# Patient Record
Sex: Female | Born: 1990 | Race: White | Hispanic: No | Smoking: Never smoker
Health system: Southern US, Community
[De-identification: ages and names within clinical notes are randomized; demographics above are authoritative.]

## PROBLEM LIST (undated history)

## (undated) ENCOUNTER — Inpatient Hospital Stay (HOSPITAL_COMMUNITY): Payer: Self-pay

## (undated) DIAGNOSIS — F419 Anxiety disorder, unspecified: Secondary | ICD-10-CM

## (undated) DIAGNOSIS — J45909 Unspecified asthma, uncomplicated: Secondary | ICD-10-CM

## (undated) DIAGNOSIS — F329 Major depressive disorder, single episode, unspecified: Secondary | ICD-10-CM

## (undated) DIAGNOSIS — R000 Tachycardia, unspecified: Secondary | ICD-10-CM

## (undated) DIAGNOSIS — F32A Depression, unspecified: Secondary | ICD-10-CM

## (undated) HISTORY — PX: WISDOM TOOTH EXTRACTION: SHX21

## (undated) HISTORY — PX: MYRINGOTOMY: SUR874

---
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
## 2011-03-21 ENCOUNTER — Ambulatory Visit (INDEPENDENT_AMBULATORY_CARE_PROVIDER_SITE_OTHER): Payer: 59 | Admitting: Family Medicine

## 2011-03-21 VITALS — BP 106/74 | HR 83 | Temp 98.1°F | Resp 16 | Ht 61.0 in | Wt 129.0 lb

## 2011-03-21 DIAGNOSIS — J029 Acute pharyngitis, unspecified: Secondary | ICD-10-CM

## 2011-03-21 DIAGNOSIS — H6690 Otitis media, unspecified, unspecified ear: Secondary | ICD-10-CM

## 2011-03-21 MED ORDER — CEFDINIR 300 MG PO CAPS: 300.0000 mg | ORAL_CAPSULE | Freq: Two times a day (BID) | ORAL | Status: AC

## 2011-03-21 NOTE — Patient Instructions (Signed)
Thank you for coming in today.  I appreciate your patience as we become more comfortable with our computer system.  Today you saw Contrell Ballentine, MD. I hope you feel better quickly. If you were not given printed prescriptions today, your medications have been sent to your specified pharmacy and can be picked up there.   

## 2011-03-21 NOTE — Progress Notes (Signed)
  Subjective:    Patient ID: Olivia Dean, female    DOB: 10/22/1990, 21 y.o.   MRN: [id number]  HPI 21 yo female with URI symptoms for 4 days.   Nasal congestion, running, sore throat, right ear pain/pressure. No fever.  Tickly cough.  REd dots in throat.  No nausea, vomitting.  Slight headache.    Review of Systems    Negative except as per HPI  Objective:   Physical Exam  Constitutional: She appears well-developed. No distress.  HENT:  Right Ear: External ear and ear canal normal. Tympanic membrane is injected and bulging. Tympanic membrane is not scarred, not perforated, not erythematous and not retracted.  Left Ear: Tympanic membrane, external ear and ear canal normal. Tympanic membrane is not injected, not scarred, not perforated, not erythematous, not retracted and not bulging.  Nose: No mucosal edema or rhinorrhea. Right sinus exhibits no maxillary sinus tenderness and no frontal sinus tenderness. Left sinus exhibits no maxillary sinus tenderness and no frontal sinus tenderness.  Mouth/Throat: Uvula is midline, oropharynx is clear and moist and mucous membranes are normal. No oropharyngeal exudate or tonsillar abscesses.  Cardiovascular: Normal rate, regular rhythm, normal heart sounds and intact distal pulses.   No murmur heard. Pulmonary/Chest: Effort normal and breath sounds normal. No respiratory distress. She has no wheezes. She has no rales.  Lymphadenopathy:       Head (right side): No submandibular and no preauricular adenopathy present.       Head (left side): No submandibular and no preauricular adenopathy present.       Right cervical: No superficial cervical and no posterior cervical adenopathy present.      Left cervical: No superficial cervical and no posterior cervical adenopathy present.       Right: No supraclavicular adenopathy present.       Left: No supraclavicular adenopathy present.  Skin: Skin is warm and dry.          Assessment & Plan:   URI Right otitis media  omnicef 300 BID for 10 days.

## 2012-11-02 ENCOUNTER — Emergency Department (HOSPITAL_COMMUNITY): Payer: 59

## 2012-11-02 ENCOUNTER — Emergency Department (HOSPITAL_COMMUNITY)
Admission: EM | Admit: 2012-11-02 | Discharge: 2012-11-03 | Disposition: A | Discharge status: Home / Self Care | Payer: 59 | Attending: Emergency Medicine | Admitting: Emergency Medicine

## 2012-11-02 ENCOUNTER — Encounter (HOSPITAL_COMMUNITY): Payer: Self-pay | Admitting: Emergency Medicine

## 2012-11-02 DIAGNOSIS — R0602 Shortness of breath: Secondary | ICD-10-CM | POA: Insufficient documentation

## 2012-11-02 DIAGNOSIS — Z79899 Other long term (current) drug therapy: Secondary | ICD-10-CM | POA: Insufficient documentation

## 2012-11-02 DIAGNOSIS — F411 Generalized anxiety disorder: Secondary | ICD-10-CM | POA: Insufficient documentation

## 2012-11-02 DIAGNOSIS — R0789 Other chest pain: Secondary | ICD-10-CM | POA: Insufficient documentation

## 2012-11-02 DIAGNOSIS — R000 Tachycardia, unspecified: Secondary | ICD-10-CM | POA: Insufficient documentation

## 2012-11-02 DIAGNOSIS — Z880 Allergy status to penicillin: Secondary | ICD-10-CM | POA: Insufficient documentation

## 2012-11-02 DIAGNOSIS — R110 Nausea: Secondary | ICD-10-CM | POA: Insufficient documentation

## 2012-11-02 MED ORDER — KETOROLAC TROMETHAMINE 30 MG/ML IJ SOLN
30.0000 mg | Freq: Once | INTRAMUSCULAR | Status: AC
  Administered 2012-11-02: 30 mg via INTRAMUSCULAR
  Filled 2012-11-02: qty 1

## 2012-11-02 MED ORDER — KETOROLAC TROMETHAMINE 30 MG/ML IJ SOLN: 30.0000 mg | Freq: Once | INTRAMUSCULAR | Status: DC

## 2012-11-02 NOTE — ED Notes (Signed)
I have been having real bad chest pain for the past couple of days. Pain in the center of my chest with a lot of pressure per pt. Feel nauseated at this time and feel like I may vomit, but I think it is because I am really nervous.

## 2012-11-02 NOTE — ED Provider Notes (Signed)
ECG interpretation   Date: 11/02/2012  Rate: 101  Rhythm: normal sinus rhythm  QRS Axis: normal  Intervals: normal  ST/T Wave abnormalities: normal  Conduction Disutrbances: none  Narrative Interpretation:   Old EKG Reviewed: No significant changes noted     Lyanne Co, MD 11/02/12 2213

## 2012-11-02 NOTE — ED Notes (Signed)
Pt states under some stress at this time. Pt states she just tries to calm herself down, & that she was on medication at one time but not at the present. Chest pain mid chest that does not radiate. Pt denies n/v.

## 2012-11-02 NOTE — ED Provider Notes (Signed)
CSN: [id number]     Arrival date & time 11/02/12  2146 History  This chart was scribed for No att. providers found by Ronal Fear, ED Scribe. This patient was seen in room APA17/APA17 and the patient's care was started at 11:20 PM.     Chief Complaint  Patient presents with  . Chest Pain   The history is provided by the patient. No language interpreter was used.    HPI Comments: Olivia Dean is a 22 y.o. female who presents to the Emergency Department complaining of sudden onset 3/10 chest  pain and pressure that is worse with anxiety while at work with associated mild SOB and nausea. Pt denies fever, chills, cough, congestion. Pt is taking Prozac with no relief. Denies hx of blood clots or muscle weakness. She denies any recent long distance travel. Pt is not currently on birth control.  Patient reports ocassional chest pain in the setting of anxiety in the past.  Patient currently rates pain at 2-3/10.  It is nonradiating and substernal.  Patient can point to one spot on her chest that hurts.   History reviewed. No pertinent past medical history. History reviewed. No pertinent past surgical history. No family history on file. History  Substance Use Topics  . Smoking status: Never Smoker   . Smokeless tobacco: Not on file  . Alcohol Use: Yes   OB History   Grav Para Term Preterm Abortions TAB SAB Ect Mult Living                 Review of Systems  Constitutional: Negative for fever.  Respiratory: Positive for shortness of breath.   Cardiovascular: Positive for chest pain. Negative for leg swelling.  Gastrointestinal: Negative for abdominal pain.  All other systems reviewed and are negative.    Allergies  Penicillins and Nickel  Home Medications   Current Outpatient Rx  Name  Route  Sig  Dispense  Refill  . BIOTIN PO   Oral   Take 1 tablet by mouth daily.         Marland Kitchen FLUoxetine (PROZAC) 20 MG capsule   Oral   Take 20 mg by mouth daily.         Marland Kitchen ibuprofen  (ADVIL,MOTRIN) 600 MG tablet   Oral   Take 1 tablet (600 mg total) by mouth every 6 (six) hours as needed for pain.   30 tablet   0    BP 128/73  Pulse 97  Temp(Src) 98.7 F (37.1 C) (Oral)  Resp 16  Ht 5\' 1"  (1.549 m)  Wt 135 lb (61.236 kg)  BMI 25.52 kg/m2  SpO2 99%  LMP 10/26/2012 Physical Exam  Nursing note and vitals reviewed. Constitutional: She is oriented to person, place, and time. She appears well-developed and well-nourished.  HENT:  Head: Normocephalic and atraumatic.  Cardiovascular: Regular rhythm and normal heart sounds.   tachycardia  Pulmonary/Chest: Effort normal and breath sounds normal. No respiratory distress. She exhibits tenderness.  TTP over anterior chest wall.  Abdominal: Soft. Bowel sounds are normal. There is no tenderness.  Musculoskeletal: She exhibits no edema.  Neurological: She is alert and oriented to person, place, and time.  Skin: Skin is warm and dry.  Psychiatric: She has a normal mood and affect.    ED Course  Procedures (including critical care time)  11:22 PM- Pt advised of plan for treatment including Chest X-ray and pain medication and pt agrees.   Labs Review Labs Reviewed  D-DIMER, QUANTITATIVE  Imaging Review Dg Chest 2 View  11/02/2012   CLINICAL DATA:  Central chest pressure, nausea and vomiting  EXAM: CHEST  2 VIEW  COMPARISON:  11/13/2006  FINDINGS: The heart size and mediastinal contours are within normal limits. Both lungs are clear. The visualized skeletal structures are unremarkable.  IMPRESSION: Normal chest radiograph.   Electronically Signed   By: Genevive Bi M.D.   On: 11/02/2012 22:34    EKG Interpretation   None     EkG:  Sinus tachycardia without evidence of ischemia  MDM   1. Musculoskeletal chest pain    Patient presents with CP.  Non toxic and VS notable for tachycardia.  Low risk for PE.  Dimer is negative.  CHest xray, EKG and trop reassuring.  Reproducible chest pain on exam.  Given  toradol.  Patient later endorsed that sometimes she gets the pain after sex.  Patient reassured and encouraged to use Ibuprofen at home for relief.  To f/u with PCP.  After history, exam, and medical workup I feel the patient has been appropriately medically screened and is safe for discharge home. Pertinent diagnoses were discussed with the patient. Patient was given return precautions.   I personally performed the services described in this documentation, which was scribed in my presence. The recorded information has been reviewed and is accurate.    Shon Baton, MD 11/04/12 [phone]

## 2012-11-03 MED ORDER — IBUPROFEN 600 MG PO TABS: 600.0000 mg | ORAL_TABLET | Freq: Four times a day (QID) | ORAL | Status: DC | PRN

## 2012-11-03 NOTE — ED Notes (Signed)
Pt alert & oriented x4, stable gait. Patient given discharge instructions, paperwork & prescription(s). Patient  instructed to stop at the registration desk to finish any additional paperwork. Patient verbalized understanding. Pt left department w/ no further questions. 

## 2013-12-23 LAB — OB RESULTS CONSOLE HIV ANTIBODY (ROUTINE TESTING): HIV: NONREACTIVE

## 2013-12-23 LAB — OB RESULTS CONSOLE ABO/RH: RH Type: POSITIVE

## 2013-12-23 LAB — OB RESULTS CONSOLE HEPATITIS B SURFACE ANTIGEN: HEP B S AG: NEGATIVE

## 2013-12-23 LAB — OB RESULTS CONSOLE RUBELLA ANTIBODY, IGM: Rubella: IMMUNE

## 2013-12-23 LAB — OB RESULTS CONSOLE GC/CHLAMYDIA
CHLAMYDIA, DNA PROBE: NEGATIVE
GC PROBE AMP, GENITAL: NEGATIVE

## 2013-12-23 LAB — OB RESULTS CONSOLE RPR: RPR: NONREACTIVE

## 2013-12-23 LAB — OB RESULTS CONSOLE ANTIBODY SCREEN: Antibody Screen: NEGATIVE

## 2014-05-22 ENCOUNTER — Inpatient Hospital Stay (HOSPITAL_COMMUNITY)
Admission: AD | Admit: 2014-05-22 | Discharge: 2014-05-22 | Disposition: A | Discharge status: Home / Self Care | Payer: Managed Care, Other (non HMO) | Source: Ambulatory Visit | Attending: Obstetrics and Gynecology | Admitting: Obstetrics and Gynecology

## 2014-05-22 ENCOUNTER — Encounter (HOSPITAL_COMMUNITY): Payer: Self-pay | Admitting: *Deleted

## 2014-05-22 DIAGNOSIS — N898 Other specified noninflammatory disorders of vagina: Secondary | ICD-10-CM | POA: Insufficient documentation

## 2014-05-22 DIAGNOSIS — Z3A30 30 weeks gestation of pregnancy: Secondary | ICD-10-CM | POA: Insufficient documentation

## 2014-05-22 DIAGNOSIS — O26892 Other specified pregnancy related conditions, second trimester: Secondary | ICD-10-CM | POA: Diagnosis not present

## 2014-05-22 DIAGNOSIS — O9989 Other specified diseases and conditions complicating pregnancy, childbirth and the puerperium: Secondary | ICD-10-CM | POA: Insufficient documentation

## 2014-05-22 HISTORY — DX: Tachycardia, unspecified: R00.0

## 2014-05-22 HISTORY — DX: Major depressive disorder, single episode, unspecified: F32.9

## 2014-05-22 HISTORY — DX: Anxiety disorder, unspecified: F41.9

## 2014-05-22 HISTORY — DX: Depression, unspecified: F32.A

## 2014-05-22 HISTORY — DX: Unspecified asthma, uncomplicated: J45.909

## 2014-05-22 LAB — URINALYSIS, ROUTINE W REFLEX MICROSCOPIC
Bilirubin Urine: NEGATIVE
GLUCOSE, UA: NEGATIVE mg/dL
HGB URINE DIPSTICK: NEGATIVE
KETONES UR: NEGATIVE mg/dL
Leukocytes, UA: NEGATIVE
Nitrite: NEGATIVE
PH: 7.5 (ref 5.0–8.0)
Protein, ur: NEGATIVE mg/dL
Specific Gravity, Urine: 1.015 (ref 1.005–1.030)
Urobilinogen, UA: 0.2 mg/dL (ref 0.0–1.0)

## 2014-05-22 LAB — AMNISURE RUPTURE OF MEMBRANE (ROM) NOT AT ARMC: AMNISURE: NEGATIVE

## 2014-05-22 NOTE — Discharge Instructions (Signed)
Third Trimester of Pregnancy The third trimester is from week 29 through week 42, months 7 through 9. The third trimester is a time when the fetus is growing rapidly. At the end of the ninth month, the fetus is about 20 inches in length and weighs 6-10 pounds.  BODY CHANGES Your body goes through many changes during pregnancy. The changes vary from woman to woman.   Your weight will continue to increase. You can expect to gain 25-35 pounds (11-16 kg) by the end of the pregnancy.  You may begin to get stretch marks on your hips, abdomen, and breasts.  You may urinate more often because the fetus is moving lower into your pelvis and pressing on your bladder.  You may develop or continue to have heartburn as a result of your pregnancy.  You may develop constipation because certain hormones are causing the muscles that push waste through your intestines to slow down.  You may develop hemorrhoids or swollen, bulging veins (varicose veins).  You may have pelvic pain because of the weight gain and pregnancy hormones relaxing your joints between the bones in your pelvis. Backaches may result from overexertion of the muscles supporting your posture.  You may have changes in your hair. These can include thickening of your hair, rapid growth, and changes in texture. Some women also have hair loss during or after pregnancy, or hair that feels dry or thin. Your hair will most likely return to normal after your baby is born.  Your breasts will continue to grow and be tender. A yellow discharge may leak from your breasts called colostrum.  Your belly button may stick out.  You may feel short of breath because of your expanding uterus.  You may notice the fetus "dropping," or moving lower in your abdomen.  You may have a bloody mucus discharge. This usually occurs a few days to a week before labor begins.  Your cervix becomes thin and soft (effaced) near your due date. WHAT TO EXPECT AT YOUR PRENATAL  EXAMS  You will have prenatal exams every 2 weeks until week 36. Then, you will have weekly prenatal exams. During a routine prenatal visit:  You will be weighed to make sure you and the fetus are growing normally.  Your blood pressure is taken.  Your abdomen will be measured to track your baby's growth.  The fetal heartbeat will be listened to.  Any test results from the previous visit will be discussed.  You may have a cervical check near your due date to see if you have effaced. At around 36 weeks, your caregiver will check your cervix. At the same time, your caregiver will also perform a test on the secretions of the vaginal tissue. This test is to determine if a type of bacteria, Group B streptococcus, is present. Your caregiver will explain this further. Your caregiver may ask you:  What your birth plan is.  How you are feeling.  If you are feeling the baby move.  If you have had any abnormal symptoms, such as leaking fluid, bleeding, severe headaches, or abdominal cramping.  If you have any questions. Other tests or screenings that may be performed during your third trimester include:  Blood tests that check for low iron levels (anemia).  Fetal testing to check the health, activity level, and growth of the fetus. Testing is done if you have certain medical conditions or if there are problems during the pregnancy. FALSE LABOR You may feel small, irregular contractions that   eventually go away. These are called Braxton Hicks contractions, or false labor. Contractions may last for hours, days, or even weeks before true labor sets in. If contractions come at regular intervals, intensify, or become painful, it is best to be seen by your caregiver.  SIGNS OF LABOR   Menstrual-like cramps.  Contractions that are 5 minutes apart or less.  Contractions that start on the top of the uterus and spread down to the lower abdomen and back.  A sense of increased pelvic pressure or back  pain.  A watery or bloody mucus discharge that comes from the vagina. If you have any of these signs before the 37th week of pregnancy, call your caregiver right away. You need to go to the hospital to get checked immediately. HOME CARE INSTRUCTIONS   Avoid all smoking, herbs, alcohol, and unprescribed drugs. These chemicals affect the formation and growth of the baby.  Follow your caregiver's instructions regarding medicine use. There are medicines that are either safe or unsafe to take during pregnancy.  Exercise only as directed by your caregiver. Experiencing uterine cramps is a good sign to stop exercising.  Continue to eat regular, healthy meals.  Wear a good support bra for breast tenderness.  Do not use hot tubs, steam rooms, or saunas.  Wear your seat belt at all times when driving.  Avoid raw meat, uncooked cheese, cat litter boxes, and soil used by cats. These carry germs that can cause birth defects in the baby.  Take your prenatal vitamins.  Try taking a stool softener (if your caregiver approves) if you develop constipation. Eat more high-fiber foods, such as fresh vegetables or fruit and whole grains. Drink plenty of fluids to keep your urine clear or pale yellow.  Take warm sitz baths to soothe any pain or discomfort caused by hemorrhoids. Use hemorrhoid cream if your caregiver approves.  If you develop varicose veins, wear support hose. Elevate your feet for 15 minutes, 3-4 times a day. Limit salt in your diet.  Avoid heavy lifting, wear low heal shoes, and practice good posture.  Rest a lot with your legs elevated if you have leg cramps or low back pain.  Visit your dentist if you have not gone during your pregnancy. Use a soft toothbrush to brush your teeth and be gentle when you floss.  A sexual relationship may be continued unless your caregiver directs you otherwise.  Do not travel far distances unless it is absolutely necessary and only with the approval  of your caregiver.  Take prenatal classes to understand, practice, and ask questions about the labor and delivery.  Make a trial run to the hospital.  Pack your hospital bag.  Prepare the baby's nursery.  Continue to go to all your prenatal visits as directed by your caregiver. SEEK MEDICAL CARE IF:  You are unsure if you are in labor or if your water has broken.  You have dizziness.  You have mild pelvic cramps, pelvic pressure, or nagging pain in your abdominal area.  You have persistent nausea, vomiting, or diarrhea.  You have a bad smelling vaginal discharge.  You have pain with urination. SEEK IMMEDIATE MEDICAL CARE IF:   You have a fever.  You are leaking fluid from your vagina.  You have spotting or bleeding from your vagina.  You have severe abdominal cramping or pain.  You have rapid weight loss or gain.  You have shortness of breath with chest pain.  You notice sudden or extreme swelling   of your face, hands, ankles, feet, or legs.  You have not felt your baby move in over an hour.  You have severe headaches that do not go away with medicine.  You have vision changes. Document Released: 12/27/2000 Document Revised: 01/07/2013 Document Reviewed: 03/05/2012 Exit[employer] Patient Information 2015 [employer]. This information is not intended to replace advice given to you by your health care provider. Make sure you discuss any questions you have with your health care provider.  

## 2014-05-22 NOTE — MAU Note (Signed)
Pt felt gush of fluid @ approx 1230 today, felt two more gushes.  Now still feels damp, unsure if leaking fluid.  Abdomen has felt tighter today, denies bleeding.

## 2014-05-22 NOTE — Progress Notes (Signed)
Patient states she has "really bad anxiety" and has been hospitalized for tachycardia before.

## 2014-05-22 NOTE — MAU Provider Note (Signed)
History     CSN: [id number]  Arrival date and time: 05/22/14 1705   First Provider Initiated Contact with Patient 05/22/14 1821      Chief Complaint  Patient presents with  . Rupture of Membranes   HPI  24 y.o. G1P0 @ 6337w0d presents to the MAU after she felt 2 gushes of fluid during her graduation ceremony. She denies vaginal bleeding,she reports braxton Hicks contractions. She states baby is moving well.  Past Medical History  Diagnosis Date  . Anxiety   . Asthma     exercise induced  . Tachycardia   . Depression     Past Surgical History  Procedure Laterality Date  . Myringotomy      History reviewed. No pertinent family history.  History  Substance Use Topics  . Smoking status: Never Smoker   . Smokeless tobacco: Not on file  . Alcohol Use: Yes    Allergies:  Allergies  Allergen Reactions  . Penicillins Hives  . Nickel Rash    Prescriptions prior to admission  Medication Sig Dispense Refill Last Dose  . flintstones complete (FLINTSTONES) 60 MG chewable tablet Chew 1 tablet by mouth daily.   05/22/2014 at Unknown time  . Prenatal Vit-Fe Fumarate-FA (PRENATAL MULTIVITAMIN) TABS tablet Take 1 tablet by mouth daily at 12 noon.   05/21/2014 at Unknown time  . ibuprofen (ADVIL,MOTRIN) 600 MG tablet Take 1 tablet (600 mg total) by mouth every 6 (six) hours as needed for pain. (Patient not taking: Reported on 05/22/2014) 30 tablet 0 Not Taking at Unknown time    Review of Systems  Gastrointestinal: Positive for abdominal pain.       Mild abdominal pain; braxton hicks contractions  Genitourinary:       Having vaginal discharge; Leaking fluid  All other systems reviewed and are negative.  Physical Exam   Blood pressure 124/73, pulse 107, temperature 98 F (36.7 C), temperature source Oral, resp. rate 20, SpO2 98 %. Results for orders placed or performed during the hospital encounter of 05/22/14 (from the past 24 hour(s))  Urinalysis, Routine w reflex microscopic      Status: Abnormal   Collection Time: 05/22/14  5:27 PM  Result Value Ref Range   Color, Urine YELLOW YELLOW   APPearance HAZY (A) CLEAR   Specific Gravity, Urine 1.015 1.005 - 1.030   pH 7.5 5.0 - 8.0   Glucose, UA NEGATIVE NEGATIVE mg/dL   Hgb urine dipstick NEGATIVE NEGATIVE   Bilirubin Urine NEGATIVE NEGATIVE   Ketones, ur NEGATIVE NEGATIVE mg/dL   Protein, ur NEGATIVE NEGATIVE mg/dL   Urobilinogen, UA 0.2 0.0 - 1.0 mg/dL   Nitrite NEGATIVE NEGATIVE   Leukocytes, UA NEGATIVE NEGATIVE  Amnisure rupture of membrane (rom)     Status: None   Collection Time: 05/22/14  6:24 PM  Result Value Ref Range   Amnisure ROM NEGATIVE     Physical Exam  Nursing note and vitals reviewed. Constitutional: She is oriented to person, place, and time. She appears well-developed and well-nourished. No distress.  HENT:  Head: Normocephalic and atraumatic.  Neck: Normal range of motion.  Cardiovascular: Normal rate.   Respiratory: Effort normal. No respiratory distress.  GI: Soft.  Musculoskeletal: Normal range of motion. She exhibits no edema.  Neurological: She is alert and oriented to person, place, and time.  Skin: Skin is warm and dry.  Psychiatric: She has a normal mood and affect. Her behavior is normal. Judgment and thought content normal.   Fht's Category 1  Uterine irritability MAU Course  Procedures  MDM Efm; amnisure  Assessment and Plan  IUP @ 30+0 Vaginal Discharge   Keep next regular scheduled appt Return to MAU prn   Billiejean Schimek Grissett 05/22/2014, 6:25 PM

## 2014-07-15 ENCOUNTER — Encounter (HOSPITAL_COMMUNITY): Payer: Self-pay | Admitting: *Deleted

## 2014-07-15 ENCOUNTER — Inpatient Hospital Stay (HOSPITAL_COMMUNITY)
Admission: AD | Admit: 2014-07-15 | Discharge: 2014-07-15 | Disposition: A | Discharge status: Home / Self Care | Payer: Managed Care, Other (non HMO) | Source: Ambulatory Visit | Attending: Obstetrics and Gynecology | Admitting: Obstetrics and Gynecology

## 2014-07-15 DIAGNOSIS — O9989 Other specified diseases and conditions complicating pregnancy, childbirth and the puerperium: Secondary | ICD-10-CM | POA: Diagnosis present

## 2014-07-15 DIAGNOSIS — N898 Other specified noninflammatory disorders of vagina: Secondary | ICD-10-CM | POA: Diagnosis not present

## 2014-07-15 DIAGNOSIS — Z3A37 37 weeks gestation of pregnancy: Secondary | ICD-10-CM | POA: Diagnosis not present

## 2014-07-15 LAB — POCT FERN TEST: POCT Fern Test: NEGATIVE

## 2014-07-15 NOTE — MAU Note (Signed)
Urine in lab 

## 2014-07-15 NOTE — MAU Note (Signed)
Patient presents at [redacted] weeks gestation with c/o leaking fluid since 0200 today. Fetus active. Denies bleeding.

## 2014-07-15 NOTE — MAU Provider Note (Signed)
Subjective:  [name] is a 24 y.o. female G1P0 at 4970w5d presenting to MAU with concerns of ROM.  She woke up feeling like her pregnancy pillow was wet. She did not recall urinating on herself. She has not continued to leak fluid while here, however does have a lot of white vaginal discharge.   Objective:  GENERAL: Well-developed, well-nourished female in no acute distress.  LUNGS: Effort normal SKIN: Warm, dry and without erythema PSYCH: Normal mood and affect  Filed Vitals:   07/15/14 1127  BP: 128/68  Pulse: 93  Temp:   Resp: 18   Speculum exam: Vagina - Small amount of creamy, white discharge, no odor. No pooling of clear fluid in the vagina Cervix - No contact bleeding Chaperone present for exam.  MDM:  Crist FatFern slide negative for amniotic fluid  Assessment:  Vaginal discharge in the third trimester    Plan:  RN to inform Dr. Lianne MorisLowe     Wilmont Olund I Chaunice Obie, NP 07/15/2014 10:40 AM

## 2014-07-15 NOTE — Discharge Instructions (Signed)
Keep your scheduled appointment for prenatal care. Call the office or provider on call with further concerns or return to MAU as needed. Drink 8-10 glasses of water per day.

## 2014-07-31 ENCOUNTER — Encounter (HOSPITAL_COMMUNITY): Payer: Self-pay | Admitting: *Deleted

## 2014-07-31 ENCOUNTER — Telehealth (HOSPITAL_COMMUNITY): Payer: Self-pay | Admitting: *Deleted

## 2014-07-31 LAB — OB RESULTS CONSOLE GBS: GBS: NEGATIVE

## 2014-07-31 NOTE — Telephone Encounter (Signed)
Preadmission screen  

## 2014-08-06 ENCOUNTER — Inpatient Hospital Stay (HOSPITAL_COMMUNITY): Payer: Managed Care, Other (non HMO) | Admitting: Anesthesiology

## 2014-08-06 ENCOUNTER — Encounter (HOSPITAL_COMMUNITY)
Admission: RE | Disposition: A | Discharge status: Home / Self Care | Payer: Self-pay | Source: Ambulatory Visit | Attending: Obstetrics and Gynecology

## 2014-08-06 ENCOUNTER — Encounter (HOSPITAL_COMMUNITY): Payer: Self-pay

## 2014-08-06 ENCOUNTER — Inpatient Hospital Stay (HOSPITAL_COMMUNITY)
Admission: RE | Admit: 2014-08-06 | Discharge: 2014-08-09 | DRG: 766 | Disposition: A | Discharge status: Home / Self Care | Payer: Managed Care, Other (non HMO) | Source: Ambulatory Visit | Attending: Obstetrics and Gynecology | Admitting: Obstetrics and Gynecology

## 2014-08-06 DIAGNOSIS — D72829 Elevated white blood cell count, unspecified: Secondary | ICD-10-CM | POA: Diagnosis not present

## 2014-08-06 DIAGNOSIS — Z833 Family history of diabetes mellitus: Secondary | ICD-10-CM

## 2014-08-06 DIAGNOSIS — Z3A40 40 weeks gestation of pregnancy: Secondary | ICD-10-CM | POA: Diagnosis present

## 2014-08-06 DIAGNOSIS — O339 Maternal care for disproportion, unspecified: Principal | ICD-10-CM | POA: Diagnosis present

## 2014-08-06 DIAGNOSIS — O480 Post-term pregnancy: Secondary | ICD-10-CM | POA: Diagnosis present

## 2014-08-06 DIAGNOSIS — Z8249 Family history of ischemic heart disease and other diseases of the circulatory system: Secondary | ICD-10-CM | POA: Diagnosis not present

## 2014-08-06 LAB — TYPE AND SCREEN
ABO/RH(D): O POS
ANTIBODY SCREEN: NEGATIVE

## 2014-08-06 LAB — CBC
HEMATOCRIT: 38.9 % (ref 36.0–46.0)
Hemoglobin: 13.3 g/dL (ref 12.0–15.0)
MCH: 30.6 pg (ref 26.0–34.0)
MCHC: 34.2 g/dL (ref 30.0–36.0)
MCV: 89.6 fL (ref 78.0–100.0)
Platelets: 153 10*3/uL (ref 150–400)
RBC: 4.34 MIL/uL (ref 3.87–5.11)
RDW: 13.8 % (ref 11.5–15.5)
WBC: 15.6 10*3/uL — ABNORMAL HIGH (ref 4.0–10.5)

## 2014-08-06 LAB — RPR: RPR: NONREACTIVE

## 2014-08-06 LAB — ABO/RH: ABO/RH(D): O POS

## 2014-08-06 SURGERY — Surgical Case
Anesthesia: Epidural | Site: Abdomen

## 2014-08-06 MED ORDER — MORPHINE SULFATE 0.5 MG/ML IJ SOLN
INTRAMUSCULAR | Status: AC
  Filled 2014-08-06: qty 10

## 2014-08-06 MED ORDER — OXYTOCIN 10 UNIT/ML IJ SOLN
INTRAMUSCULAR | Status: AC
  Filled 2014-08-06: qty 4

## 2014-08-06 MED ORDER — ONDANSETRON HCL 4 MG/2ML IJ SOLN
INTRAMUSCULAR | Status: DC | PRN
  Administered 2014-08-06: 4 mg via INTRAVENOUS

## 2014-08-06 MED ORDER — OXYTOCIN 10 UNIT/ML IJ SOLN
40.0000 [IU] | INTRAVENOUS | Status: DC | PRN
  Administered 2014-08-06: 40 [IU] via INTRAVENOUS

## 2014-08-06 MED ORDER — PHENYLEPHRINE 40 MCG/ML (10ML) SYRINGE FOR IV PUSH (FOR BLOOD PRESSURE SUPPORT)
80.0000 ug | PREFILLED_SYRINGE | INTRAVENOUS | Status: DC | PRN
  Filled 2014-08-06: qty 20

## 2014-08-06 MED ORDER — CEFAZOLIN (ANCEF) 1 G IV SOLR: 1.0000 g | INTRAVENOUS | Status: DC

## 2014-08-06 MED ORDER — DIPHENHYDRAMINE HCL 50 MG/ML IJ SOLN: 12.5000 mg | INTRAMUSCULAR | Status: DC | PRN

## 2014-08-06 MED ORDER — LIDOCAINE-EPINEPHRINE (PF) 2 %-1:200000 IJ SOLN
INTRAMUSCULAR | Status: AC
  Filled 2014-08-06: qty 20

## 2014-08-06 MED ORDER — LIDOCAINE HCL (PF) 1 % IJ SOLN: 30.0000 mL | INTRAMUSCULAR | Status: DC | PRN

## 2014-08-06 MED ORDER — LACTATED RINGERS IV SOLN
INTRAVENOUS | Status: DC | PRN
  Administered 2014-08-06: 23:00:00 via INTRAVENOUS

## 2014-08-06 MED ORDER — OXYCODONE-ACETAMINOPHEN 5-325 MG PO TABS: 2.0000 | ORAL_TABLET | ORAL | Status: DC | PRN

## 2014-08-06 MED ORDER — MEPERIDINE HCL 25 MG/ML IJ SOLN: 6.2500 mg | INTRAMUSCULAR | Status: DC | PRN

## 2014-08-06 MED ORDER — ACETAMINOPHEN 325 MG PO TABS
650.0000 mg | ORAL_TABLET | ORAL | Status: DC | PRN
Start: 2014-08-06 — End: 2014-08-07

## 2014-08-06 MED ORDER — ACETAMINOPHEN 500 MG PO TABS
1000.0000 mg | ORAL_TABLET | Freq: Four times a day (QID) | ORAL | Status: DC
  Administered 2014-08-07: 1000 mg via ORAL

## 2014-08-06 MED ORDER — FENTANYL 2.5 MCG/ML BUPIVACAINE 1/10 % EPIDURAL INFUSION (WH - ANES)
14.0000 mL/h | INTRAMUSCULAR | Status: DC | PRN
  Administered 2014-08-06 (×2): 14 mL/h via EPIDURAL
  Filled 2014-08-06 (×2): qty 125

## 2014-08-06 MED ORDER — LACTATED RINGERS IV SOLN: 500.0000 mL | INTRAVENOUS | Status: DC | PRN

## 2014-08-06 MED ORDER — ONDANSETRON HCL 4 MG/2ML IJ SOLN
INTRAMUSCULAR | Status: AC
  Filled 2014-08-06: qty 2

## 2014-08-06 MED ORDER — OXYCODONE-ACETAMINOPHEN 5-325 MG PO TABS: 1.0000 | ORAL_TABLET | ORAL | Status: DC | PRN

## 2014-08-06 MED ORDER — SCOPOLAMINE 1 MG/3DAYS TD PT72
MEDICATED_PATCH | TRANSDERMAL | Status: DC | PRN
  Administered 2014-08-06: 1 via TRANSDERMAL

## 2014-08-06 MED ORDER — OXYTOCIN BOLUS FROM INFUSION: 500.0000 mL | INTRAVENOUS | Status: DC

## 2014-08-06 MED ORDER — EPHEDRINE 5 MG/ML INJ: 10.0000 mg | INTRAVENOUS | Status: DC | PRN

## 2014-08-06 MED ORDER — LACTATED RINGERS IV SOLN
INTRAVENOUS | Status: DC
  Administered 2014-08-06: 13:00:00 via INTRAVENOUS

## 2014-08-06 MED ORDER — MORPHINE SULFATE (PF) 0.5 MG/ML IJ SOLN
INTRAMUSCULAR | Status: DC | PRN
  Administered 2014-08-06: 1 mg via INTRAVENOUS
  Administered 2014-08-06: 4 mg via EPIDURAL

## 2014-08-06 MED ORDER — CITRIC ACID-SODIUM CITRATE 334-500 MG/5ML PO SOLN
30.0000 mL | ORAL | Status: DC | PRN
  Administered 2014-08-06 (×2): 30 mL via ORAL
  Filled 2014-08-06 (×2): qty 15

## 2014-08-06 MED ORDER — SODIUM BICARBONATE 8.4 % IV SOLN
INTRAVENOUS | Status: DC | PRN
  Administered 2014-08-06 (×2): 5 mL via EPIDURAL

## 2014-08-06 MED ORDER — ZOLPIDEM TARTRATE 5 MG PO TABS
5.0000 mg | ORAL_TABLET | Freq: Every evening | ORAL | Status: DC | PRN
  Administered 2014-08-06: 5 mg via ORAL
  Filled 2014-08-06: qty 1

## 2014-08-06 MED ORDER — ONDANSETRON HCL 4 MG/2ML IJ SOLN: 4.0000 mg | Freq: Four times a day (QID) | INTRAMUSCULAR | Status: DC | PRN

## 2014-08-06 MED ORDER — HYDROMORPHONE HCL 1 MG/ML IJ SOLN
0.2500 mg | INTRAMUSCULAR | Status: DC | PRN
  Administered 2014-08-07: 0.5 mg via INTRAVENOUS

## 2014-08-06 MED ORDER — SCOPOLAMINE 1 MG/3DAYS TD PT72
MEDICATED_PATCH | TRANSDERMAL | Status: AC
  Filled 2014-08-06: qty 1

## 2014-08-06 MED ORDER — SODIUM BICARBONATE 8.4 % IV SOLN
INTRAVENOUS | Status: AC
  Filled 2014-08-06: qty 50

## 2014-08-06 MED ORDER — BUTORPHANOL TARTRATE 1 MG/ML IJ SOLN: 1.0000 mg | INTRAMUSCULAR | Status: DC | PRN

## 2014-08-06 MED ORDER — FENTANYL CITRATE (PF) 100 MCG/2ML IJ SOLN
INTRAMUSCULAR | Status: AC
  Filled 2014-08-06: qty 2

## 2014-08-06 MED ORDER — OXYTOCIN 40 UNITS IN LACTATED RINGERS INFUSION - SIMPLE MED
1.0000 m[IU]/min | INTRAVENOUS | Status: DC
Start: 2014-08-06 — End: 2014-08-07
  Administered 2014-08-06: 1 m[IU]/min via INTRAVENOUS
  Filled 2014-08-06: qty 1000

## 2014-08-06 MED ORDER — GENTAMICIN SULFATE 40 MG/ML IJ SOLN
Freq: Once | INTRAMUSCULAR | Status: AC
  Administered 2014-08-06: 100 mL via INTRAVENOUS
  Filled 2014-08-06: qty 9.5

## 2014-08-06 MED ORDER — MISOPROSTOL 25 MCG QUARTER TABLET
25.0000 ug | ORAL_TABLET | ORAL | Status: AC
  Administered 2014-08-06: 25 ug via VAGINAL
  Filled 2014-08-06: qty 0.25

## 2014-08-06 MED ORDER — LIDOCAINE HCL (PF) 1 % IJ SOLN
INTRAMUSCULAR | Status: DC | PRN
  Administered 2014-08-06 (×2): 4 mL

## 2014-08-06 MED ORDER — 0.9 % SODIUM CHLORIDE (POUR BTL) OPTIME
TOPICAL | Status: DC | PRN
  Administered 2014-08-06: 1000 mL

## 2014-08-06 MED ORDER — FLEET ENEMA 7-19 GM/118ML RE ENEM: 1.0000 | ENEMA | RECTAL | Status: DC | PRN

## 2014-08-06 MED ORDER — OXYTOCIN 40 UNITS IN LACTATED RINGERS INFUSION - SIMPLE MED
62.5000 mL/h | INTRAVENOUS | Status: DC
  Filled 2014-08-06: qty 1000

## 2014-08-06 MED ORDER — FENTANYL CITRATE (PF) 100 MCG/2ML IJ SOLN
INTRAMUSCULAR | Status: DC | PRN
  Administered 2014-08-06: 50 ug via INTRAVENOUS
  Administered 2014-08-06: 100 ug via EPIDURAL
  Administered 2014-08-06: 50 ug via INTRAVENOUS

## 2014-08-06 MED ORDER — TERBUTALINE SULFATE 1 MG/ML IJ SOLN: 0.2500 mg | Freq: Once | INTRAMUSCULAR | Status: AC | PRN

## 2014-08-06 SURGICAL SUPPLY — 30 items
CLAMP CORD UMBIL (MISCELLANEOUS) IMPLANT
CLOSURE WOUND 1/2 X4 (GAUZE/BANDAGES/DRESSINGS) ×1
CLOTH BEACON ORANGE TIMEOUT ST (SAFETY) ×3 IMPLANT
DRAPE SHEET LG 3/4 BI-LAMINATE (DRAPES) IMPLANT
DRSG OPSITE POSTOP 4X10 (GAUZE/BANDAGES/DRESSINGS) ×3 IMPLANT
DURAPREP 26ML APPLICATOR (WOUND CARE) ×3 IMPLANT
ELECT REM PT RETURN 9FT ADLT (ELECTROSURGICAL) ×3
ELECTRODE REM PT RTRN 9FT ADLT (ELECTROSURGICAL) ×1 IMPLANT
EXTRACTOR VACUUM M CUP 4 TUBE (SUCTIONS) IMPLANT
EXTRACTOR VACUUM M CUP 4' TUBE (SUCTIONS)
GLOVE BIO SURGEON STRL SZ 6.5 (GLOVE) ×2 IMPLANT
GLOVE BIO SURGEON STRL SZ7 (GLOVE) ×3 IMPLANT
GLOVE BIO SURGEONS STRL SZ 6.5 (GLOVE) ×1
GLOVE SURG SS PI 7.0 STRL IVOR (GLOVE) ×4 IMPLANT
GOWN STRL REUS W/TWL LRG LVL3 (GOWN DISPOSABLE) ×6 IMPLANT
KIT ABG SYR 3ML LUER SLIP (SYRINGE) IMPLANT
NDL HYPO 25X5/8 SAFETYGLIDE (NEEDLE) ×1 IMPLANT
NEEDLE HYPO 25X5/8 SAFETYGLIDE (NEEDLE) ×3 IMPLANT
NS IRRIG 1000ML POUR BTL (IV SOLUTION) ×3 IMPLANT
PACK C SECTION WH (CUSTOM PROCEDURE TRAY) ×3 IMPLANT
PAD OB MATERNITY 4.3X12.25 (PERSONAL CARE ITEMS) ×3 IMPLANT
STRIP CLOSURE SKIN 1/2X4 (GAUZE/BANDAGES/DRESSINGS) ×2 IMPLANT
SUT CHROMIC 0 CTX 36 (SUTURE) ×9 IMPLANT
SUT MON AB 4-0 PS1 27 (SUTURE) ×3 IMPLANT
SUT PDS AB 0 CT1 27 (SUTURE) ×4 IMPLANT
SUT PDS AB 0 CTX 36 PDP370T (SUTURE) ×4 IMPLANT
SUT VIC AB 3-0 CT1 27 (SUTURE) ×3
SUT VIC AB 3-0 CT1 TAPERPNT 27 (SUTURE) ×1 IMPLANT
TOWEL OR 17X24 6PK STRL BLUE (TOWEL DISPOSABLE) ×3 IMPLANT
TRAY FOLEY CATH SILVER 14FR (SET/KITS/TRAYS/PACK) ×3 IMPLANT

## 2014-08-06 NOTE — Op Note (Signed)
Preoperative diagnosis: CPD  Postoperative diagnosis: Same  Procedure: Primary low transverse cesarean section  Surgeon: Marcelle Overlie  Anesthesia: Epidural  EBL: 700 cc  Procedure and findings:  The patient was taken the operating room after an adequate level of epidural anesthesia was obtained with the patient in left tilt position the abdomen prepped and draped in the usual fashion appropriate timeouts were taken at that point transverse incision made 2 finger breaths above the symphysis carried down the fascia which was incised and extended transversely. Rectus muscles divided in the midline, peritoneum entered superiorly without incident and extended in a vertical fashion. The vesicouterine serosa was incised and the bladder was bluntly and sharply dissected below, bladder blade repositioned. Transverse incision made in the lower uterine segment extended with blunt dissection, the paced and then delivered of a healthy female from the vertex presentation, the infant was suctioned cord clamped and passed the pediatric team for further care. The placenta was then delivered manually intact, uterus exteriorized cavity wiped clean with laparotomy pack closure obtained the first layer of 0 chromic in a locked fashion followed by an imbricating layer of 0 chromic. This was hemostatic bilateral tubes and ovaries were normal. Prior to closure sponge, needle, history precast reported as correct 2. Peritoneum closed with a 30 biker running suture, the same to reapproximate the rectus muscles in the midline 0 PDS was then used from laterally to midline on either side to close the fascia septated tissue was fairly minimal and hemostatic. 4-0 Monocryl subcuticular closure honeycomb dressing applied, clear urine noted at in the case. She went to recovery room in good condition.  Dictated with [facility]  Kindrick Lankford Milana Obey M.D.

## 2014-08-06 NOTE — Transfer of Care (Signed)
Immediate Anesthesia Transfer of Care Note  Patient: Olivia Dean  Procedure(s) Performed: Procedure(s): CESAREAN SECTION (N/A)  Patient Location: PACU  Anesthesia Type:Epidural  Level of Consciousness: awake, oriented and patient cooperative  Airway & Oxygen Therapy: Patient Spontanous Breathing  Post-op Assessment: Report given to RN and Post -op Vital signs reviewed and stable  Post vital signs: Reviewed and stable  Last Vitals:  Filed Vitals:   08/06/14 2330  BP: 127/70  Pulse: 143  Temp: 37.6 C  Resp: 18    Complications: No apparent anesthesia complications

## 2014-08-06 NOTE — Anesthesia Procedure Notes (Signed)
Epidural Patient location during procedure: OB  Staffing Anesthesiologist: Jordin Dambrosio Performed by: anesthesiologist   Preanesthetic Checklist Completed: patient identified, site marked, surgical consent, pre-op evaluation, timeout performed, IV checked, risks and benefits discussed and monitors and equipment checked  Epidural Patient position: sitting Prep: site prepped and draped and DuraPrep Patient monitoring: continuous pulse ox and blood pressure Approach: midline Injection technique: LOR air  Needle:  Needle type: Tuohy  Needle gauge: 17 G Needle length: 9 cm and 9 Needle insertion depth: 5 cm cm Catheter type: closed end flexible Catheter size: 19 Gauge Catheter at skin depth: 9 cm Test dose: negative  Assessment Events: blood not aspirated, injection not painful, no injection resistance, negative IV test and no paresthesia  Additional Notes Patient identified. Risks/Benefits/Options discussed with patient including but not limited to bleeding, infection, nerve damage, paralysis, failed block, incomplete pain control, headache, blood pressure changes, nausea, vomiting, reactions to medication both or allergic, itching and postpartum back pain. Confirmed with bedside nurse the patient's most recent platelet count. Confirmed with patient that they are not currently taking any anticoagulation, have any bleeding history or any family history of bleeding disorders. Patient expressed understanding and wished to proceed. All questions were answered. Sterile technique was used throughout the entire procedure. Please see nursing notes for vital signs. Test dose was given through epidural catheter and negative prior to continuing to dose epidural or start infusion. Warning signs of high block given to the patient including shortness of breath, tingling/numbness in hands, complete motor block, or any concerning symptoms with instructions to call for help. Patient was given  instructions on fall risk and not to get out of bed. All questions and concerns addressed with instructions to call with any issues or inadequate analgesia.

## 2014-08-06 NOTE — H&P (Signed)
Olivia Dean is a 24 y.o. female presenting for IOL. Maternal Medical History:  Fetal activity: Perceived fetal activity is normal.      OB History    Gravida Para Term Preterm AB TAB SAB Ectopic Multiple Living   1         0     Past Medical History  Diagnosis Date  . Asthma     exercise induced  . Tachycardia   . Depression   . Anxiety     meds 2 years ago   Past Surgical History  Procedure Laterality Date  . Myringotomy     Family History: family history includes Cancer in her paternal grandmother; Diabetes in her paternal grandmother; Heart attack in her mother; Hypertension in her maternal grandmother, mother, and paternal grandmother; Migraines in her maternal grandfather and maternal uncle; Thyroid disease in her paternal grandmother. Social History:  reports that she has never smoked. She has never used smokeless tobacco. She reports that she drinks alcohol. She reports that she does not use illicit drugs.   Prenatal Transfer Tool  Maternal Diabetes: No Genetic Screening: Normal Maternal Ultrasounds/Referrals: Normal Fetal Ultrasounds or other Referrals:  None Maternal Substance Abuse:  No Significant Maternal Medications:  None Significant Maternal Lab Results:  None Other Comments:  None  ROS  Dilation: 2 Effacement (%): 50 Station: -3 Exam by:: Yared Susan Temperature 97.7 F (36.5 C), temperature source Oral, height  (1.549 m), weight 168 lb (76.204 kg). Exam Physical Exam  Constitutional: She is oriented to person, place, and time. She appears well-developed and well-nourished.  HENT:  Head: Normocephalic and atraumatic.  Neck: Normal range of motion. Neck supple.  Cardiovascular: Normal rate and regular rhythm.   Respiratory: Effort normal and breath sounds normal.  GI:  Term FH FHR 148  Genitourinary:  2/vtx/AROM>>clear  Musculoskeletal: Normal range of motion.  Neurological: She is alert and oriented to person, place, and time.     Prenatal labs: ABO, Rh: --/--/O POS (07/21 0215) Antibody: NEG (07/21 0215) Rubella: Immune (12/08 0000) RPR: Nonreactive (12/08 0000)  HBsAg: Negative (12/08 0000)  HIV: Non-reactive (12/08 0000)  GBS: Negative (07/15 0000)   Assessment/Plan: Term preg, for IOL   Holston Oyama M 08/06/2014, 7:38 AM

## 2014-08-06 NOTE — Anesthesia Preprocedure Evaluation (Signed)
Anesthesia Evaluation  Patient identified by MRN, date of birth, ID band Patient awake    Reviewed: Allergy & Precautions, H&P , NPO status , Patient's Chart, lab work & pertinent test results, reviewed documented beta blocker date and time   Airway Mallampati: II  TM Distance: >3 FB Neck ROM: full    Dental no notable dental hx.    Pulmonary asthma ,  breath sounds clear to auscultation  Pulmonary exam normal       Cardiovascular negative cardio ROS Normal cardiovascular examRhythm:regular Rate:Normal     Neuro/Psych negative neurological ROS  negative psych ROS   GI/Hepatic negative GI ROS, Neg liver ROS,   Endo/Other  negative endocrine ROS  Renal/GU negative Renal ROS  negative genitourinary   Musculoskeletal   Abdominal   Peds  Hematology negative hematology ROS (+)   Anesthesia Other Findings Pregnancy - uncomplicated Platelets and allergies reviewed Denies active cardiac or pulmonary symptoms, METS > 4  Denies blood thinning medications, bleeding disorders, hypertension, supine hypotension syndrome, previous anesthesia difficulties    Reproductive/Obstetrics (+) Pregnancy                             Anesthesia Physical Anesthesia Plan  ASA: II  Anesthesia Plan: Epidural   Post-op Pain Management:    Induction:   Airway Management Planned:   Additional Equipment:   Intra-op Plan:   Post-operative Plan:   Informed Consent: I have reviewed the patients History and Physical, chart, labs and discussed the procedure including the risks, benefits and alternatives for the proposed anesthesia with the patient or authorized representative who has indicated his/her understanding and acceptance.     Plan Discussed with:   Anesthesia Plan Comments:         Anesthesia Quick Evaluation

## 2014-08-06 NOTE — Progress Notes (Signed)
4-5/90/-1, IUPC + ISE, FHR cat I, will cont pit aug per protocol

## 2014-08-06 NOTE — Progress Notes (Signed)
Still no change beyond 4-5 cm, now with incr caput>>rec CS for CPD, proced + risks reviewed

## 2014-08-07 ENCOUNTER — Encounter (HOSPITAL_COMMUNITY): Payer: Self-pay

## 2014-08-07 LAB — CBC
HCT: 31 % — ABNORMAL LOW (ref 36.0–46.0)
Hemoglobin: 10.3 g/dL — ABNORMAL LOW (ref 12.0–15.0)
MCH: 30.1 pg (ref 26.0–34.0)
MCHC: 33.5 g/dL (ref 30.0–36.0)
MCV: 89.6 fL (ref 78.0–100.0)
PLATELETS: 126 10*3/uL — AB (ref 150–400)
RBC: 3.46 MIL/uL — ABNORMAL LOW (ref 3.87–5.11)
RDW: 13.9 % (ref 11.5–15.5)
WBC: 22.9 10*3/uL — ABNORMAL HIGH (ref 4.0–10.5)

## 2014-08-07 MED ORDER — OXYTOCIN 40 UNITS IN LACTATED RINGERS INFUSION - SIMPLE MED: 62.5000 mL/h | INTRAVENOUS | Status: AC

## 2014-08-07 MED ORDER — SIMETHICONE 80 MG PO CHEW
80.0000 mg | CHEWABLE_TABLET | ORAL | Status: DC
  Administered 2014-08-08: 80 mg via ORAL
  Filled 2014-08-07 (×2): qty 1

## 2014-08-07 MED ORDER — DIPHENHYDRAMINE HCL 50 MG/ML IJ SOLN
12.5000 mg | INTRAMUSCULAR | Status: DC | PRN
  Administered 2014-08-07: 12.5 mg via INTRAVENOUS
  Filled 2014-08-07: qty 1

## 2014-08-07 MED ORDER — MENTHOL 3 MG MT LOZG
1.0000 | LOZENGE | OROMUCOSAL | Status: DC | PRN
Start: 2014-08-07 — End: 2014-08-09

## 2014-08-07 MED ORDER — NALOXONE HCL 1 MG/ML IJ SOLN
1.0000 ug/kg/h | INTRAVENOUS | Status: DC | PRN
  Filled 2014-08-07: qty 2

## 2014-08-07 MED ORDER — IBUPROFEN 800 MG PO TABS
800.0000 mg | ORAL_TABLET | Freq: Three times a day (TID) | ORAL | Status: DC | PRN
  Administered 2014-08-07 – 2014-08-09 (×8): 800 mg via ORAL
  Filled 2014-08-07 (×8): qty 1

## 2014-08-07 MED ORDER — LANOLIN HYDROUS EX OINT: 1.0000 "application " | TOPICAL_OINTMENT | CUTANEOUS | Status: DC | PRN

## 2014-08-07 MED ORDER — WITCH HAZEL-GLYCERIN EX PADS: 1.0000 "application " | MEDICATED_PAD | CUTANEOUS | Status: DC | PRN

## 2014-08-07 MED ORDER — PRENATAL MULTIVITAMIN CH
1.0000 | ORAL_TABLET | Freq: Every day | ORAL | Status: DC
  Administered 2014-08-07 – 2014-08-09 (×3): 1 via ORAL
  Filled 2014-08-07 (×3): qty 1

## 2014-08-07 MED ORDER — SCOPOLAMINE 1 MG/3DAYS TD PT72
1.0000 | MEDICATED_PATCH | Freq: Once | TRANSDERMAL | Status: DC
  Filled 2014-08-07: qty 1

## 2014-08-07 MED ORDER — ACETAMINOPHEN 500 MG PO TABS
ORAL_TABLET | ORAL | Status: AC
  Filled 2014-08-07: qty 2

## 2014-08-07 MED ORDER — DIPHENHYDRAMINE HCL 25 MG PO CAPS: 25.0000 mg | ORAL_CAPSULE | ORAL | Status: DC | PRN

## 2014-08-07 MED ORDER — SENNOSIDES-DOCUSATE SODIUM 8.6-50 MG PO TABS
2.0000 | ORAL_TABLET | ORAL | Status: DC
  Administered 2014-08-08 – 2014-08-09 (×2): 2 via ORAL
  Filled 2014-08-07 (×2): qty 2

## 2014-08-07 MED ORDER — SODIUM CHLORIDE 0.9 % IV SOLN: 250.0000 mL | INTRAVENOUS | Status: DC

## 2014-08-07 MED ORDER — TETANUS-DIPHTH-ACELL PERTUSSIS 5-2.5-18.5 LF-MCG/0.5 IM SUSP: 0.5000 mL | Freq: Once | INTRAMUSCULAR | Status: DC

## 2014-08-07 MED ORDER — SODIUM CHLORIDE 0.9 % IJ SOLN: 3.0000 mL | INTRAMUSCULAR | Status: DC | PRN

## 2014-08-07 MED ORDER — NALBUPHINE HCL 10 MG/ML IJ SOLN
5.0000 mg | Freq: Once | INTRAMUSCULAR | Status: AC | PRN
  Filled 2014-08-07: qty 1

## 2014-08-07 MED ORDER — SODIUM CHLORIDE 0.9 % IJ SOLN
3.0000 mL | INTRAMUSCULAR | Status: DC | PRN
  Administered 2014-08-07: 3 mL via INTRAVENOUS
  Filled 2014-08-07: qty 3

## 2014-08-07 MED ORDER — NALBUPHINE HCL 10 MG/ML IJ SOLN: 5.0000 mg | INTRAMUSCULAR | Status: DC | PRN

## 2014-08-07 MED ORDER — FLEET ENEMA 7-19 GM/118ML RE ENEM: 1.0000 | ENEMA | Freq: Every day | RECTAL | Status: DC | PRN

## 2014-08-07 MED ORDER — ZOLPIDEM TARTRATE 5 MG PO TABS: 5.0000 mg | ORAL_TABLET | Freq: Every evening | ORAL | Status: DC | PRN

## 2014-08-07 MED ORDER — SIMETHICONE 80 MG PO CHEW: 80.0000 mg | CHEWABLE_TABLET | ORAL | Status: DC | PRN

## 2014-08-07 MED ORDER — SODIUM CHLORIDE 0.9 % IJ SOLN
3.0000 mL | Freq: Two times a day (BID) | INTRAMUSCULAR | Status: DC
  Administered 2014-08-07: 3 mL via INTRAVENOUS

## 2014-08-07 MED ORDER — BISACODYL 10 MG RE SUPP: 10.0000 mg | Freq: Every day | RECTAL | Status: DC | PRN

## 2014-08-07 MED ORDER — ONDANSETRON HCL 4 MG/2ML IJ SOLN: 4.0000 mg | Freq: Three times a day (TID) | INTRAMUSCULAR | Status: DC | PRN

## 2014-08-07 MED ORDER — LACTATED RINGERS IV SOLN
INTRAVENOUS | Status: DC
  Administered 2014-08-07 (×2): via INTRAVENOUS

## 2014-08-07 MED ORDER — HYDROMORPHONE HCL 1 MG/ML IJ SOLN
INTRAMUSCULAR | Status: AC
  Filled 2014-08-07: qty 1

## 2014-08-07 MED ORDER — OXYCODONE-ACETAMINOPHEN 5-325 MG PO TABS
2.0000 | ORAL_TABLET | ORAL | Status: DC | PRN
  Administered 2014-08-07 – 2014-08-08 (×3): 2 via ORAL
  Filled 2014-08-07 (×2): qty 2

## 2014-08-07 MED ORDER — DIBUCAINE 1 % RE OINT: 1.0000 "application " | TOPICAL_OINTMENT | RECTAL | Status: DC | PRN

## 2014-08-07 MED ORDER — NALBUPHINE HCL 10 MG/ML IJ SOLN: 5.0000 mg | Freq: Once | INTRAMUSCULAR | Status: AC | PRN

## 2014-08-07 MED ORDER — ACETAMINOPHEN 325 MG PO TABS
650.0000 mg | ORAL_TABLET | ORAL | Status: DC | PRN
Start: 2014-08-07 — End: 2014-08-09
  Administered 2014-08-07: 650 mg via ORAL
  Filled 2014-08-07: qty 2

## 2014-08-07 MED ORDER — OXYCODONE-ACETAMINOPHEN 5-325 MG PO TABS
1.0000 | ORAL_TABLET | ORAL | Status: DC | PRN
  Administered 2014-08-07 – 2014-08-09 (×4): 1 via ORAL
  Filled 2014-08-07 (×4): qty 1

## 2014-08-07 MED ORDER — MEASLES, MUMPS & RUBELLA VAC ~~LOC~~ INJ
0.5000 mL | INJECTION | Freq: Once | SUBCUTANEOUS | Status: DC
  Filled 2014-08-07: qty 0.5

## 2014-08-07 MED ORDER — NALOXONE HCL 0.4 MG/ML IJ SOLN: 0.4000 mg | INTRAMUSCULAR | Status: DC | PRN

## 2014-08-07 MED ORDER — SIMETHICONE 80 MG PO CHEW
80.0000 mg | CHEWABLE_TABLET | Freq: Three times a day (TID) | ORAL | Status: DC
Start: 2014-08-07 — End: 2014-08-09
  Administered 2014-08-07 – 2014-08-09 (×7): 80 mg via ORAL
  Filled 2014-08-07 (×8): qty 1

## 2014-08-07 MED ORDER — DIPHENHYDRAMINE HCL 25 MG PO CAPS: 25.0000 mg | ORAL_CAPSULE | Freq: Four times a day (QID) | ORAL | Status: DC | PRN

## 2014-08-07 MED ORDER — PNEUMOCOCCAL VAC POLYVALENT 25 MCG/0.5ML IJ INJ
0.5000 mL | INJECTION | INTRAMUSCULAR | Status: DC
  Filled 2014-08-07 (×2): qty 0.5

## 2014-08-07 NOTE — Progress Notes (Signed)
Pt requested to delay getting out of bed for another so that she could get more sleep.

## 2014-08-07 NOTE — Progress Notes (Signed)
Subjective: Postpartum Day 1: Cesarean Delivery Patient reports tolerating PO.    Objective: Vital signs in last 24 hours: Temp:  [98.3 F (36.8 C)-99.7 F (37.6 C)] 98.6 F (37 C) (07/22 0523) Pulse Rate:  [72-143] 95 (07/22 0523) Resp:  [12-27] 18 (07/22 0523) BP: (95-137)/(43-78) 121/67 mmHg (07/22 0523) SpO2:  [95 %-100 %] 97 % (07/22 0523)  Physical Exam:  General: alert and cooperative Lochia: appropriate Uterine Fundus: firm Incision: healing well DVT Evaluation: No evidence of DVT seen on physical exam. Negative Homan's sign. No cords or calf tenderness. No significant calf/ankle edema.   Recent Labs  08/06/14 0215 08/07/14 0515  HGB 13.3 10.3*  HCT 38.9 31.0*    Assessment/Plan: Status post Cesarean section. Postoperative course complicated by leucocytosis  CBC in am.  CURTIS,CAROL G 08/07/2014, 8:34 AM

## 2014-08-07 NOTE — Lactation Note (Signed)
This note was copied from the chart of Olivia Casi Westerfeld. Lactation Consultation Note Baby is 38 hours old and has eaten once. He was unwrapped and placed skin -to - skin on his mother.  He was not rooting vigorously but once he was near the nipple he latched.  He fed well with spontaneous swallows.  Mom was taught hand expression with colostrum visible.  Attempted to latch to the opposite side but Rylan was content and did not latch. Information on support group and outpatient services was given to parents and explained. Follow-up tomorrow. Patient Name: Olivia Dean [id number] Date: 08/07/2014 Reason for consult: Initial assessment   Maternal Data Has patient been taught Hand Expression?: Yes Does the patient have breastfeeding experience prior to this delivery?: No  Feeding Feeding Type: Breast Fed Length of feed: 15 min  LATCH Score/Interventions Latch: Grasps breast easily, tongue down, lips flanged, rhythmical sucking. Intervention(s): Adjust position;Assist with latch;Breast compression  Audible Swallowing: Spontaneous and intermittent Intervention(s): Skin to skin;Hand expression  Type of Nipple: Everted at rest and after stimulation  Comfort (Breast/Nipple): Soft / non-tender     Hold (Positioning): Full assist, staff holds infant at breast  LATCH Score: 8  Lactation Tools Discussed/Used     Consult Status Consult Status: Follow-up Date: 08/08/14 Follow-up type: In-patient    Soyla Dryer 08/07/2014, 2:39 PM

## 2014-08-07 NOTE — Anesthesia Postprocedure Evaluation (Signed)
  Anesthesia Post-op Note  Patient: Olivia Dean  Procedure(s) Performed: Procedure(s): CESAREAN SECTION (N/A)  Patient Location: Mother/Baby  Anesthesia Type:Epidural  Level of Consciousness: awake, alert , oriented and patient cooperative  Airway and Oxygen Therapy: Patient Spontanous Breathing  Post-op Pain: none  Post-op Assessment: Post-op Vital signs reviewed, Patient's Cardiovascular Status Stable, Respiratory Function Stable, Patent Airway, No headache, No backache and Patient able to bend at knees   Post-op Vital Signs: Reviewed and stable  Last Vitals:  Filed Vitals:   08/07/14 0523  BP: 121/67  Pulse: 95  Temp: 37 C  Resp: 18    Complications: No apparent anesthesia complications

## 2014-08-07 NOTE — Anesthesia Postprocedure Evaluation (Signed)
  Anesthesia Post-op Note  Patient: Olivia Dean  Procedure(s) Performed: Procedure(s): CESAREAN SECTION (N/A)  Patient is awake, responsive, moving her legs, and has signs of resolution of her numbness. Pain and nausea are reasonably well controlled. Vital signs are stable and clinically acceptable. Oxygen saturation is clinically acceptable. There are no apparent anesthetic complications at this time. Patient is ready for discharge.

## 2014-08-07 NOTE — Addendum Note (Signed)
Addendum  created 08/07/14 1610 by Yolonda Kida, CRNA   Modules edited: Notes Section   Notes Section:  File: [id number]

## 2014-08-08 ENCOUNTER — Encounter (HOSPITAL_COMMUNITY): Payer: Self-pay | Admitting: Obstetrics and Gynecology

## 2014-08-08 LAB — CBC
HCT: 28.1 % — ABNORMAL LOW (ref 36.0–46.0)
HEMOGLOBIN: 9.1 g/dL — AB (ref 12.0–15.0)
MCH: 29.6 pg (ref 26.0–34.0)
MCHC: 32.4 g/dL (ref 30.0–36.0)
MCV: 91.5 fL (ref 78.0–100.0)
PLATELETS: 121 10*3/uL — AB (ref 150–400)
RBC: 3.07 MIL/uL — ABNORMAL LOW (ref 3.87–5.11)
RDW: 14.3 % (ref 11.5–15.5)
WBC: 17.1 10*3/uL — AB (ref 4.0–10.5)

## 2014-08-08 LAB — BIRTH TISSUE RECOVERY COLLECTION (PLACENTA DONATION)

## 2014-08-08 NOTE — Lactation Note (Signed)
This note was copied from the chart of Olivia Dean. Lactation Consultation Note; Mother describes painful latch. Observed infant chewing and chomping at breast. A shallow latch was observed.  Assist mother with adjusting infant lower jaw for wider gape and rolled top lip up. Mother describes pain scale of # 3. Observed infant for 20-25 mins. Mother nipple blanched and pinched when infant released the breast. Mother taught hand expression. Observed large amt of colostrum. Infant has a recessed chin and a high palate. Advised mother to use good breast support and take infant off breast if causing pain and re-latch.  Mother was given comfort gels. Mother also given a hand pump with a curved tip syringe. Advised that infant will cluster feed. Mother in a lot of pain and ask for pain meds. Suggested mother page for assistance with next feeding.  Patient Name: Olivia Royce Sciara [id number] Date: 08/08/2014 Reason for consult: Follow-up assessment   Maternal Data    Feeding Feeding Type: Breast Fed Length of feed: 30 min  LATCH Score/Interventions Latch: Grasps breast easily, tongue down, lips flanged, rhythmical sucking. Intervention(s): Adjust position;Assist with latch;Breast compression  Audible Swallowing: A few with stimulation  Type of Nipple: Everted at rest and after stimulation  Comfort (Breast/Nipple): Filling, red/small blisters or bruises, mild/mod discomfort  Problem noted: Filling;Cracked, bleeding, blisters, bruises;Mild/Moderate discomfort  Hold (Positioning): Assistance needed to correctly position infant at breast and maintain latch. Intervention(s): Support Pillows;Position options  LATCH Score: 7  Lactation Tools Discussed/Used     Consult Status Consult Status: Follow-up Date: 08/08/14 Follow-up type: In-patient    Stevan Born [facility] 08/08/2014, 5:49 PM

## 2014-08-08 NOTE — Progress Notes (Signed)
Subjective: Postpartum Day 2: Cesarean Delivery Patient reports tolerating PO, + flatus and no problems voiding.    Objective: Vital signs in last 24 hours: Temp:  [97.5 F (36.4 C)-98.5 F (36.9 C)] 98.1 F (36.7 C) (07/23 0640) Pulse Rate:  [71-75] 74 (07/23 0640) Resp:  [18-20] 18 (07/23 0640) BP: (100-109)/(42-57) 109/52 mmHg (07/23 0640) SpO2:  [92 %-99 %] 99 % (07/23 0640)  Physical Exam:  General: alert, cooperative, appears stated age and mild distress Lochia: appropriate Uterine Fundus: firm Incision: healing well DVT Evaluation: No evidence of DVT seen on physical exam.   Recent Labs  08/07/14 0515 08/08/14 0530  HGB 10.3* 9.1*  HCT 31.0* 28.1*    Assessment/Plan: Status post Cesarean section. Doing well postoperatively.  Continue current care.  Olivia Dean C 08/08/2014, 10:10 AM

## 2014-08-09 MED ORDER — OXYCODONE-ACETAMINOPHEN 5-325 MG PO TABS: 1.0000 | ORAL_TABLET | ORAL | Status: DC | PRN

## 2014-08-09 MED ORDER — IBUPROFEN 800 MG PO TABS: 800.0000 mg | ORAL_TABLET | Freq: Three times a day (TID) | ORAL | Status: DC | PRN

## 2014-08-09 NOTE — Progress Notes (Signed)
Subjective:Postpartum Day 3: Cesarean Delivery Patient reports incisional pain, tolerating PO, + flatus, + BM and no problems voiding.   Having difficulty with BF.  Lactation involved.  Baby has lost 8% and may not be going home today  Objective: Vital signs in last 24 hours: Temp:  [98.1 F (36.7 C)-98.6 F (37 C)] 98.1 F (36.7 C) (07/24 0616) Pulse Rate:  [71-93] 79 (07/24 0616) Resp:  [18] 18 (07/24 0616) BP: (105-116)/(56-64) 114/62 mmHg (07/24 0616) SpO2:  [100 %] 100 % (07/24 0616)  Physical Exam:  General: alert Lochia: appropriate Uterine Fundus: firm33 Incision: healing well DVT Evaluation: No evidence of DVT seen on physical exam.   Recent Labs  08/07/14 0515 08/08/14 0530  HGB 10.3* 9.1*  HCT 31.0* 28.1*    Assessment/Plan: Status post Cesarean section. Doing well postoperatively.  Continue current care .  Nykeria Mealing C 08/09/2014, 9:49 AM

## 2014-08-09 NOTE — Progress Notes (Signed)
RN in room and pneumonia vaccine with RN READY TO GIVE. PT  VERY UNDECIDED AS TO IF SHE WANTS IT. PT. STATED " I feel like it is being pushed on me". RN explained to pt. That vaccine is her choice and that she can decide yea or nay. Husband stated" her asthma is exercised induced from childhood." pt stated " I really do not know , can I wait till tomorrow to think about it? RN explained to pt. She can wait , the decision is all her's, and that no one is forcing the vaccine on her, that the vaccine is offered to asthmatics and smokers." pneumonia vaccine sent back to pharmacy

## 2014-08-09 NOTE — Lactation Note (Signed)
This note was copied from the chart of Olivia Trenna Kiely. Lactation Consultation Note: Mother describes that breastfeeding is going much better. She denies pain with latch. Observed that mother's breast are filling with nipple tissue intact. .  She is wearing comfort gels. Mother has approx. 15-20 ml of colostrum at the bedside. Mother informed that breastfeeding infants loose weight in the first week but will gain back to birth weight by week 2. She states that she is supplementing infant using a curved tip syringe while at the breast. Advised mother of continued cue base feeding and frequent STS. Reviewed treatment to prevent severe engorgement. Mother was informed of available LC out patient services and phone number to call with questions or concerns.  She plans a Peds visit with Dr Vonna Kotyk at 11;00  am. Tomorrow.  Parents very receptive to all teaching.   Patient Name: Olivia Dean [id number] Date: 08/09/2014 Reason for consult: Follow-up assessment   Maternal Data    Feeding Feeding Type: Breast Fed Length of feed: 30 min  LATCH Score/Interventions                      Lactation Tools Discussed/Used Tools: Other (comment) (curved tip syringe)   Consult Status Consult Status: Complete    Olivia Dean 08/09/2014, 4:35 PM

## 2014-08-09 NOTE — Discharge Summary (Signed)
Obstetric Discharge Summary Reason for Admission: induction of labor Prenatal Procedures: none Intrapartum Procedures: cesarean: low cervical, transverse Postpartum Procedures: none Complications-Operative and Postpartum: none HEMOGLOBIN  Date Value Ref Range Status  08/08/2014 9.1* 12.0 - 15.0 g/dL Final   HCT  Date Value Ref Range Status  08/08/2014 28.1* 36.0 - 46.0 % Final    Physical Exam:  General: alert, cooperative, appears stated age and mild distress Lochia: appropriate Uterine Fundus: firm Incision: healing well DVT Evaluation: No evidence of DVT seen on physical exam.  Discharge Diagnoses: Term Pregnancy-delivered  Discharge Information: Date: 08/09/2014 Activity: pelvic rest Diet: routine Medications: Ibuprofen and Percocet Condition: stable Instructions: refer to practice specific booklet Discharge to: home and call for incision check in 1-2 weeks   Newborn Data: Live born female  Birth Weight: 8 lb 3.2 oz (3719 g) APGAR: 9, 9  Home with mother.  Olivia Dean C 08/09/2014, 3:06 PM

## 2014-08-09 NOTE — Progress Notes (Signed)
CLINICAL SOCIAL WORK MATERNAL/CHILD NOTE  Patient Details  Name: Olivia Dean MRN: [id number] Date of Birth: 08/06/2013  Date: 08/09/2014  Clinical Social Worker Initiating Note: Chantel Teti, LCSWDate/ Time Initiated: 08/09/14/1000   Child's Name: Olivia Dean   Legal Guardian:  (Parents Shawndrea [place] and Elizebeth Koller)   Need for Interpreter: None   Date of Referral: 08/08/14   Reason for Referral: Other (Comment)   Referral Source: [facility]   Address: [address] [place], [place] [address]  Phone number:  [phone])   Household Members: Significant Other   Natural Supports (not living in the home): Extended Family, Immediate Family, Friends   Medical illustrator Supports:None   Employment: (FO[employer] is employed)   Type of Work:     Education: Engineer, maintenance Resources:Medicaid, Multimedia programmer   Other Resources:     Cultural/Religious Considerations Which May Impact Care: none noted  Strengths: Ability to meet basic needs , Home prepared for child , Pediatrician chosen    Risk Factors/Current Problems: None   Cognitive State: Alert , Able to Concentrate    Mood/Affect: Bright , Happy , Interested    CSW Assessment: Acknowledged order for social work consult to assess mother's hx of Depression and anxiety. Met with mother who was pleasant and receptive to social work. FOB also present and actively participated in the assessment. Parents reside together and have no other dependents. Mother reports hx of depression noting that it was triggered by the death of a friend while in high school. Informed that she was prescribed medication, but did not participate in therapy. She then noted a return of depressive symptoms when she was in a very stressful relationship. She denies any currently symptoms and stated that things changed significantly when she met FOB. Mother states that  anxiety is more of a problem for her and she plans to speak with her physician after she stops breastfeeding about taking something for the anxiety. She denies any current symptoms of depression or anxiety. She also denies hx of illicit drug use. No acute social concerns noted or reported at this time. She reports having an excellent support system. Mother informed of social work Fish farm manager.  CSW Plan/Description:    Provided information and resources on PP Depression No further intervention required No barriers to discharge   Beth Spackman J, LCSW 08/09/2014, 2:15 PM

## 2015-05-23 IMAGING — CR DG CHEST 2V
2 series · 2 of 2 positions shown · non-contrast
Comparison: 11/13/2006

CLINICAL DATA: Central chest pressure, nausea and vomiting

EXAM:
CHEST  2 VIEW

[view not recorded (1 of 2)]
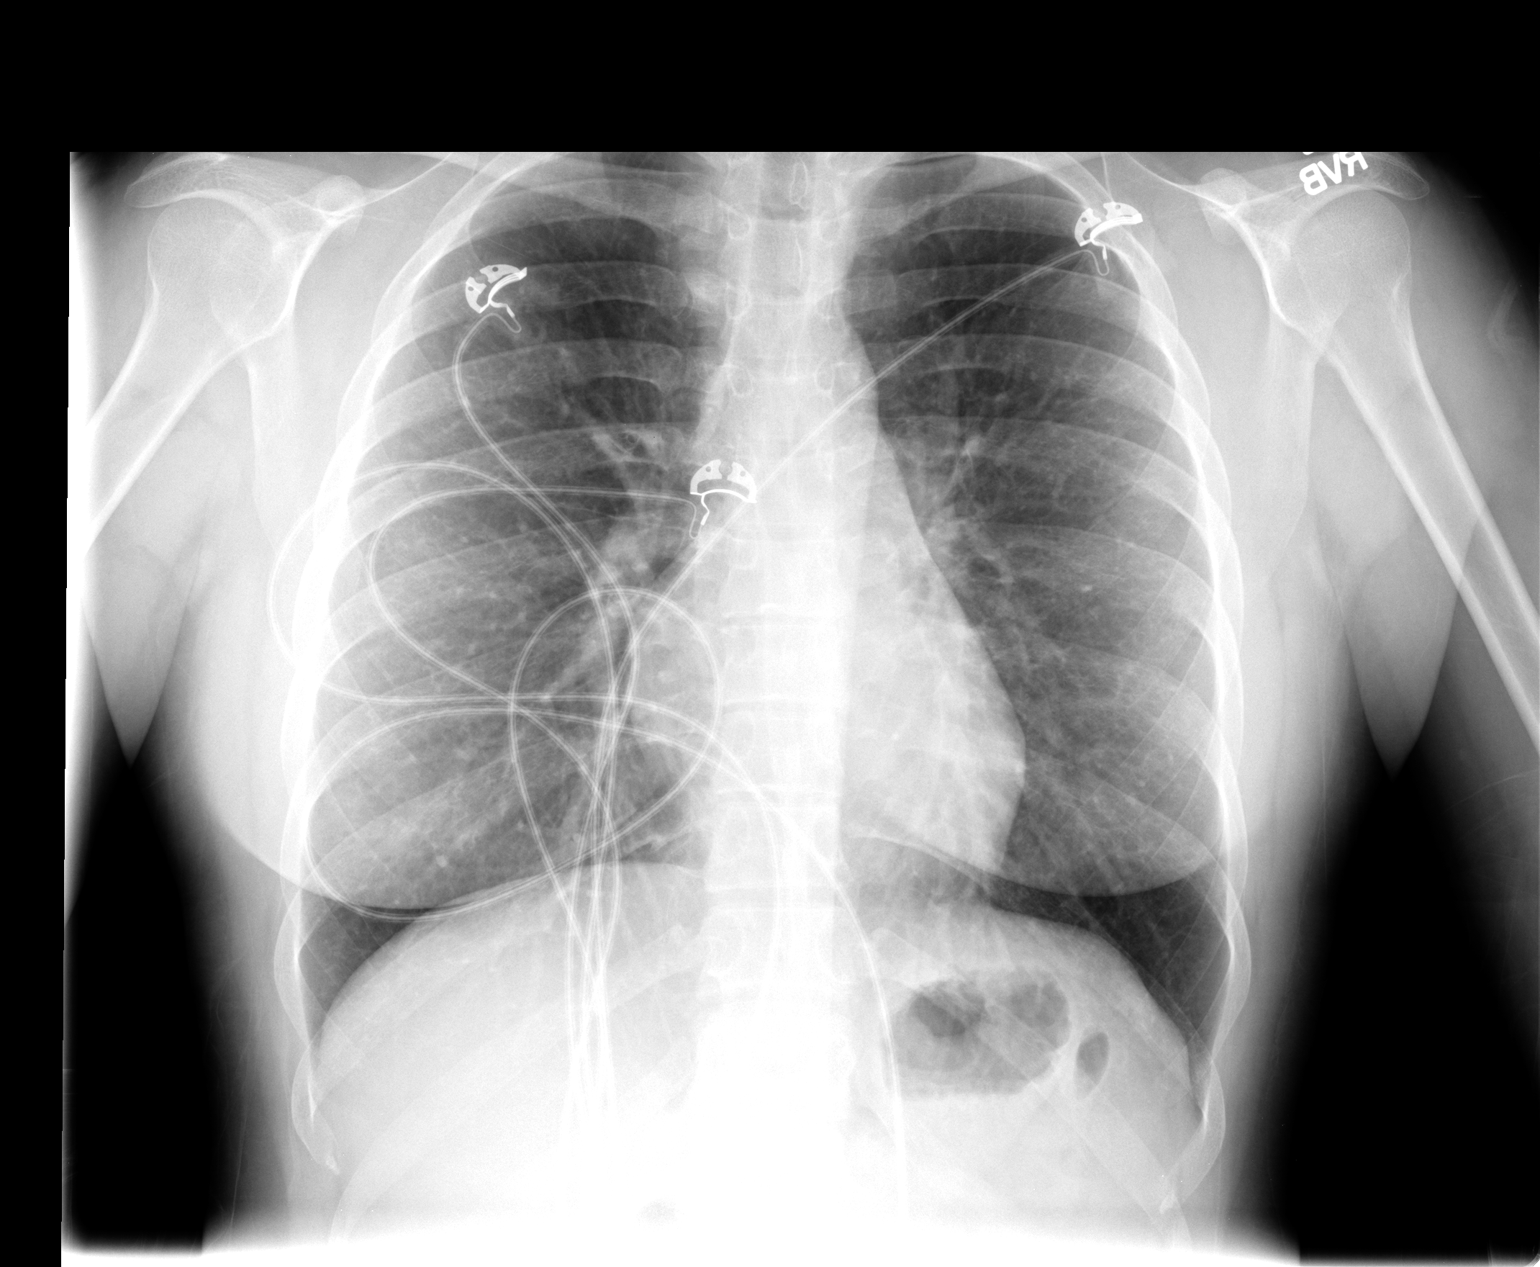

[view not recorded (2 of 2)]
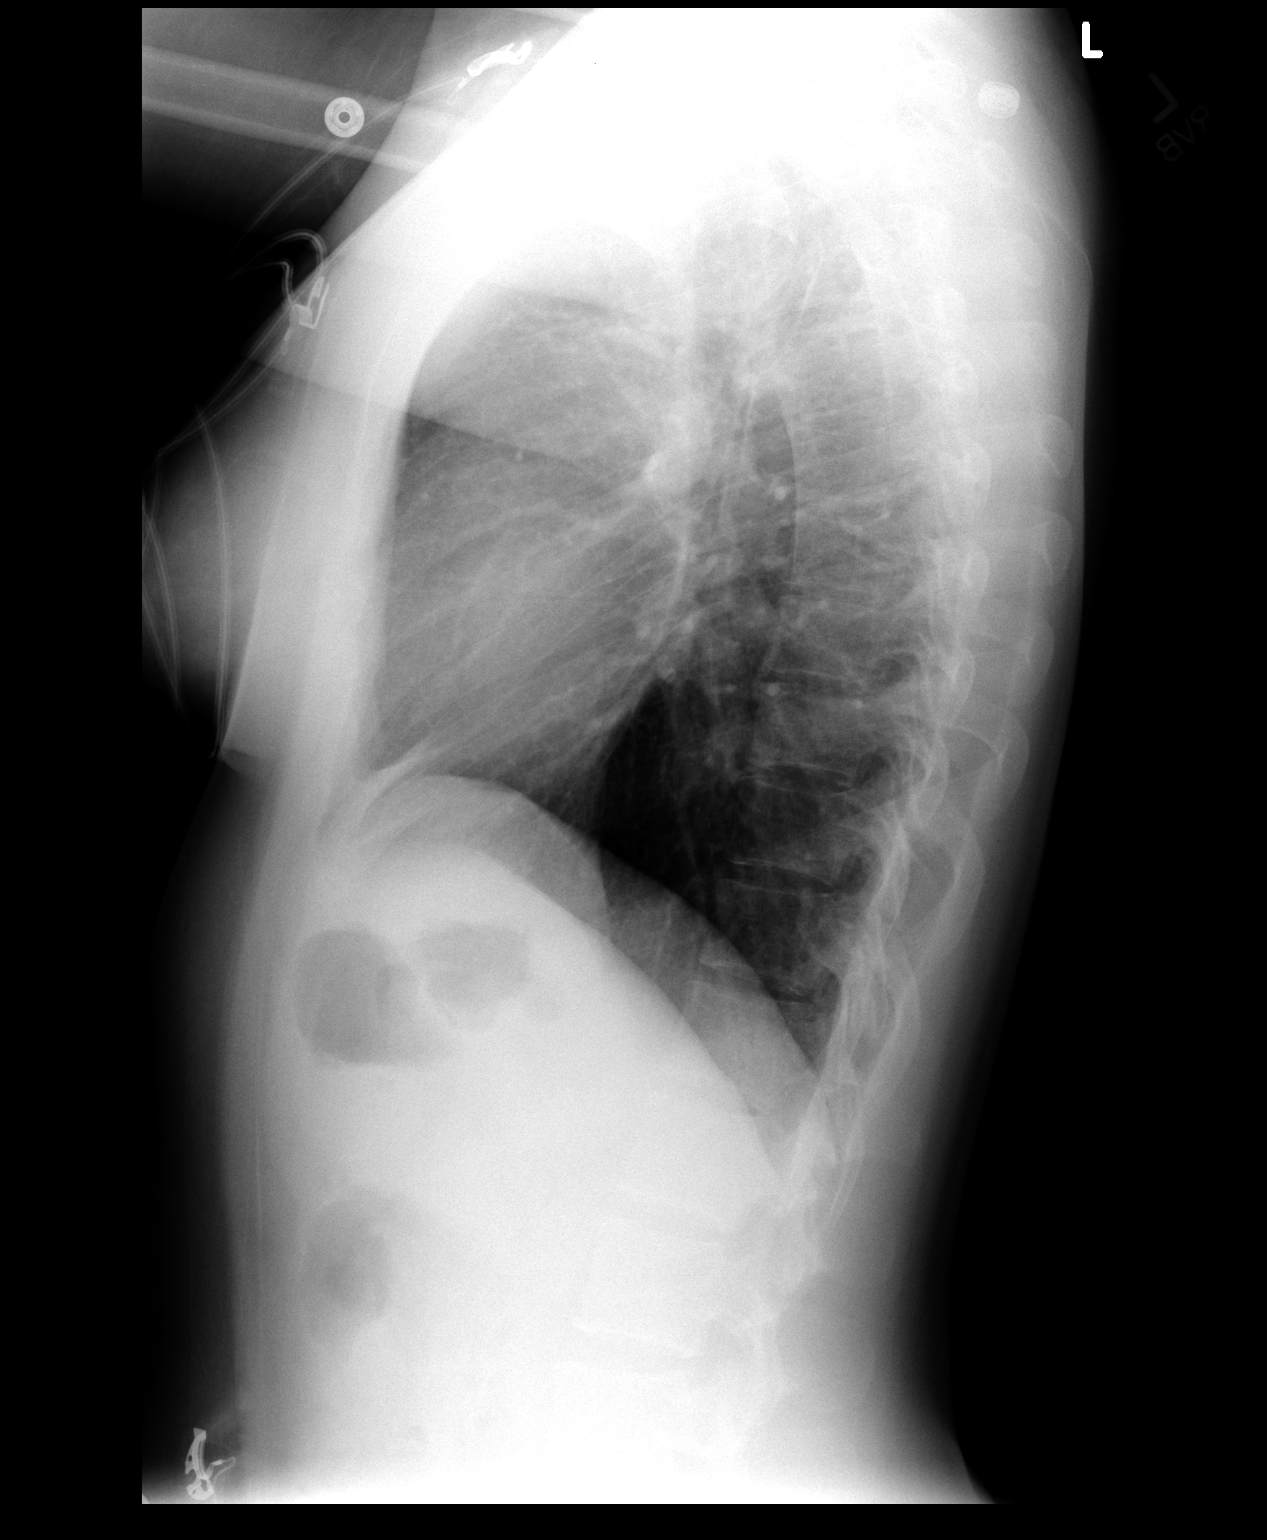

[2 of 2 positions shown; findings below may reference images not displayed]

FINDINGS: The heart size and mediastinal contours are within normal limits.
Both lungs are clear. The visualized skeletal structures are
unremarkable.
IMPRESSION: Normal chest radiograph.

## 2016-11-26 DIAGNOSIS — F329 Major depressive disorder, single episode, unspecified: Secondary | ICD-10-CM | POA: Insufficient documentation

## 2016-11-26 DIAGNOSIS — F32A Depression, unspecified: Secondary | ICD-10-CM | POA: Insufficient documentation

## 2016-11-26 DIAGNOSIS — F419 Anxiety disorder, unspecified: Secondary | ICD-10-CM | POA: Insufficient documentation

## 2018-03-19 ENCOUNTER — Ambulatory Visit: Payer: BC Managed Care – PPO | Admitting: Podiatry

## 2018-03-19 ENCOUNTER — Encounter: Payer: Self-pay | Admitting: Podiatry

## 2018-03-19 ENCOUNTER — Other Ambulatory Visit: Payer: Self-pay | Admitting: Podiatry

## 2018-03-19 ENCOUNTER — Ambulatory Visit (INDEPENDENT_AMBULATORY_CARE_PROVIDER_SITE_OTHER): Payer: BC Managed Care – PPO

## 2018-03-19 VITALS — BP 102/69 | HR 82 | Resp 16

## 2018-03-19 DIAGNOSIS — M722 Plantar fascial fibromatosis: Secondary | ICD-10-CM | POA: Diagnosis not present

## 2018-03-19 DIAGNOSIS — M778 Other enthesopathies, not elsewhere classified: Secondary | ICD-10-CM

## 2018-03-19 DIAGNOSIS — M779 Enthesopathy, unspecified: Principal | ICD-10-CM

## 2018-03-19 MED ORDER — METHYLPREDNISOLONE 4 MG PO TBPK: ORAL_TABLET | ORAL | 0 refills | Status: DC

## 2018-03-19 MED ORDER — MELOXICAM 15 MG PO TABS: 15.0000 mg | ORAL_TABLET | Freq: Every day | ORAL | 3 refills | Status: DC

## 2018-03-19 NOTE — Patient Instructions (Signed)

## 2018-03-20 NOTE — Progress Notes (Signed)
  Subjective:  Patient ID: Olivia Dean, female    DOB: August 04, 1990,  MRN: [id number] HPI Chief Complaint  Patient presents with  . Foot Pain    Plantar heel bilateral (R>L) - aching x 4-5 months, AM pain, stands a lot Printmaker), tried OTC inserts and braces, started stinging and radiating into leg now  . New Patient (Initial Visit)    28 y.o. female presents with the above complaint.   ROS: She denies fever chills nausea vomiting muscle aches pains calf pain back pain chest pain shortness of breath.  Past Medical History:  Diagnosis Date  . Anxiety    meds 2 years ago  . Asthma    exercise induced  . Depression   . Tachycardia    Past Surgical History:  Procedure Laterality Date  . CESAREAN SECTION N/A 08/06/2014   Procedure: CESAREAN SECTION;  Surgeon: Richarda Overlie, MD;  Location: [facility];  Service: Obstetrics;  Laterality: N/A;  . MYRINGOTOMY      Current Outpatient Medications:  .  Sertraline HCl (ZOLOFT PO), Take by mouth., Disp: , Rfl:  .  ALPRAZolam (XANAX) 0.25 MG tablet, , Disp: , Rfl:  .  meloxicam (MOBIC) 15 MG tablet, Take 1 tablet (15 mg total) by mouth daily., Disp: 30 tablet, Rfl: 3 .  methylPREDNISolone (MEDROL DOSEPAK) 4 MG TBPK tablet, 6 day dose pack - take as directed, Disp: 21 tablet, Rfl: 0  Allergies  Allergen Reactions  . Penicillins Hives  . Nickel Rash   Review of Systems Objective:   Vitals:   03/19/18 1559  BP: 102/69  Pulse: 82  Resp: 16    General: Well developed, nourished, in no acute distress, alert and oriented x3   Dermatological: Skin is warm, dry and supple bilateral. Nails x 10 are well maintained; remaining integument appears unremarkable at this time. There are no open sores, no preulcerative lesions, no rash or signs of infection present.  Vascular: Dorsalis Pedis artery and Posterior Tibial artery pedal pulses are 2/4 bilateral with immedate capillary fill time. Pedal hair growth present. No varicosities and no lower  extremity edema present bilateral.   Neruologic: Grossly intact via light touch bilateral. Vibratory intact via tuning fork bilateral. Protective threshold with Semmes Wienstein monofilament intact to all pedal sites bilateral. Patellar and Achilles deep tendon reflexes 2+ bilateral. No Babinski or clonus noted bilateral.   Musculoskeletal: No gross boney pedal deformities bilateral. No pain, crepitus, or limitation noted with foot and ankle range of motion bilateral. Muscular strength 5/5 in all groups tested bilateral.  Pain on palpation medial calcaneal tubercle of the right heel.  She has some pain overlying the fourth and fifth TMT.  Gait: Unassisted, Nonantalgic.    Radiographs:  Radiographs demonstrate soft tissue increase in density plantar fashion calcaneal insertion site right greater than left no acute findings.  Assessment & Plan:   Assessment: Plantar fasciitis right  Plan: Discussed etiology pathology conservative surgical therapies that she already has a night splint at home I injected her right heel today after 20 mg of Kenalog and 5 mg of Marcaine after sterile Betadine skin prep.  Start her on a Medrol Dosepak to be followed by meloxicam.  Discussed appropriate shoe gear stretching exercise ice therapy sugar modifications.  Follow-up with her in 1 month     Max T. [place], [place]

## 2018-04-23 ENCOUNTER — Ambulatory Visit: Payer: BC Managed Care – PPO | Admitting: Podiatry

## 2018-07-29 ENCOUNTER — Other Ambulatory Visit: Payer: Managed Care, Other (non HMO)

## 2018-07-29 ENCOUNTER — Other Ambulatory Visit: Payer: Self-pay

## 2018-07-29 DIAGNOSIS — Z20822 Contact with and (suspected) exposure to covid-19: Secondary | ICD-10-CM

## 2018-08-02 LAB — NOVEL CORONAVIRUS, NAA: SARS-CoV-2, NAA: NOT DETECTED

## 2019-01-08 ENCOUNTER — Ambulatory Visit: Payer: Medicaid Other | Attending: Internal Medicine

## 2019-01-08 DIAGNOSIS — Z20822 Contact with and (suspected) exposure to covid-19: Secondary | ICD-10-CM

## 2019-01-09 LAB — NOVEL CORONAVIRUS, NAA: SARS-CoV-2, NAA: NOT DETECTED

## 2019-08-17 ENCOUNTER — Encounter: Payer: Self-pay | Admitting: Emergency Medicine

## 2019-08-17 ENCOUNTER — Other Ambulatory Visit: Payer: Self-pay

## 2019-08-17 ENCOUNTER — Ambulatory Visit
Admission: EM | Admit: 2019-08-17 | Discharge: 2019-08-17 | Disposition: A | Discharge status: Home / Self Care | Payer: BC Managed Care – PPO

## 2019-08-17 DIAGNOSIS — L509 Urticaria, unspecified: Secondary | ICD-10-CM

## 2019-08-17 DIAGNOSIS — R21 Rash and other nonspecific skin eruption: Secondary | ICD-10-CM | POA: Diagnosis not present

## 2019-08-17 MED ORDER — METHYLPREDNISOLONE SODIUM SUCC 125 MG IJ SOLR
125.0000 mg | Freq: Once | INTRAMUSCULAR | Status: AC
  Administered 2019-08-17: 125 mg via INTRAMUSCULAR

## 2019-08-17 MED ORDER — HYDROXYZINE HCL 25 MG PO TABS: 25.0000 mg | ORAL_TABLET | Freq: Four times a day (QID) | ORAL | 0 refills | Status: DC

## 2019-08-17 MED ORDER — PREDNISONE 10 MG (21) PO TBPK: ORAL_TABLET | Freq: Every day | ORAL | 0 refills | Status: AC

## 2019-08-17 MED ORDER — TRIAMCINOLONE ACETONIDE 0.1 % EX CREA: 1.0000 "application " | TOPICAL_CREAM | Freq: Two times a day (BID) | CUTANEOUS | 0 refills | Status: DC

## 2019-08-17 NOTE — ED Triage Notes (Signed)
Hives all over body since yesterday.  Not sure what caused it

## 2019-08-17 NOTE — Discharge Instructions (Addendum)
We have given you a steroid injection  I have sent in a prednisone taper for you to take for 6 days. 6 tablets on day one, 5 tablets on day two, 4 tablets on day three, 3 tablets on day four, 2 tablets on day five, and 1 tablet on day six.  I have also sent in hydroxyzine for you to take at night for the itching  May use triamcinolone for itching twice daily as needed  Follow up with this office or with primary care as needed  May take Pepcid  Follow up with the ER for trouble swallowing, trouble breathing, other concerning symptoms

## 2019-08-17 NOTE — ED Provider Notes (Signed)
[facility]   [id number] 08/17/19 Arrival Time: 0805  CC: RASH  SUBJECTIVE:  Olivia Dean is a 29 y.o. female who presents with a skin complaint that began yesterday. Reports that she has had hives since yesterday. Denies precipitating event or trauma. Denies changes in soaps, detergents, close contacts with similar rash, known trigger or environmental trigger, allergy. Reports that she began taking Wellbutrin about 4 days ago. Reports that the rash is almost everywhere on her body. Describes as red, raised and itchy. Has taken benadryl with temporary relief. There are no aggravating or alleviating factors.  Denies fever, chills, nausea, vomiting, erythema, swelling, discharge, oral lesions, SOB, chest pain, abdominal pain, changes in bowel or bladder function.    ROS: As per HPI.  All other pertinent ROS negative.     Past Medical History:  Diagnosis Date  . Anxiety    meds 2 years ago  . Asthma    exercise induced  . Depression   . Tachycardia    Past Surgical History:  Procedure Laterality Date  . CESAREAN SECTION N/A 08/06/2014   Procedure: CESAREAN SECTION;  Surgeon: Richarda Overlie, MD;  Location: [facility];  Service: Obstetrics;  Laterality: N/A;  . MYRINGOTOMY     Allergies  Allergen Reactions  . Penicillins Hives  . Nickel Rash   No current facility-administered medications on file prior to encounter.   Current Outpatient Medications on File Prior to Encounter  Medication Sig Dispense Refill  . ALPRAZolam (XANAX) 0.25 MG tablet 0.25 mg as needed.     Marland Kitchen buPROPion (WELLBUTRIN XL) 150 MG 24 hr tablet Take 150 mg by mouth daily.    . meloxicam (MOBIC) 15 MG tablet Take 1 tablet (15 mg total) by mouth daily. 30 tablet 3  . methylPREDNISolone (MEDROL DOSEPAK) 4 MG TBPK tablet 6 day dose pack - take as directed 21 tablet 0  . Sertraline HCl (ZOLOFT PO) Take by mouth.     Social History   Socioeconomic History  . Marital status: Single    Spouse name: Not on  file  . Number of children: Not on file  . Years of education: Not on file  . Highest education level: Not on file  Occupational History  . Not on file  Tobacco Use  . Smoking status: Never Smoker  . Smokeless tobacco: Never Used  Substance and Sexual Activity  . Alcohol use: Yes    Comment: occ  . Drug use: No  . Sexual activity: Yes  Other Topics Concern  . Not on file  Social History Narrative  . Not on file   Social Determinants of Health   Financial Resource Strain:   . Difficulty of Paying Living Expenses:   Food Insecurity:   . Worried About Programme researcher, broadcasting/film/video in the Last Year:   . Barista in the Last Year:   Transportation Needs:   . Freight forwarder (Medical):   Marland Kitchen Lack of Transportation (Non-Medical):   Physical Activity:   . Days of Exercise per Week:   . Minutes of Exercise per Session:   Stress:   . Feeling of Stress :   Social Connections:   . Frequency of Communication with Friends and Family:   . Frequency of Social Gatherings with Friends and Family:   . Attends Religious Services:   . Active Member of Clubs or Organizations:   . Attends Banker Meetings:   Marland Kitchen Marital Status:   Intimate Partner Violence:   .  Fear of Current or Ex-Partner:   . Emotionally Abused:   Marland Kitchen Physically Abused:   . Sexually Abused:    Family History  Problem Relation Age of Onset  . Heart attack Mother   . Hypertension Mother   . Migraines Maternal Uncle   . Hypertension Maternal Grandmother   . Migraines Maternal Grandfather   . Hypertension Paternal Grandmother   . Diabetes Paternal Grandmother   . Thyroid disease Paternal Grandmother   . Cancer Paternal Grandmother        thyroid    OBJECTIVE: Vitals:   08/17/19 0814 08/17/19 0818  BP:  128/82  Pulse:  (!) 115  Resp:  18  Temp:  98.8 F (37.1 C)  TempSrc:  Oral  SpO2:  99%  Weight: 167 lb 8.8 oz (76 kg)   Height: 5\' 1"  (1.549 m)     General appearance: alert; no  distress Head: NCAT Lungs: clear to auscultation bilaterally Heart: regular rate and rhythm.  Radial pulse 2+ bilaterally Extremities: no edema Skin: warm and dry; generalized erythematous, annular, urticarial rash, consistent with hives Psychological: alert and cooperative; normal mood and affect  ASSESSMENT & PLAN:  1. Rash and nonspecific skin eruption   2. Hives     Meds ordered this encounter  Medications  . methylPREDNISolone sodium succinate (SOLU-MEDROL) 125 mg/2 mL injection 125 mg  . predniSONE (STERAPRED UNI-PAK 21 TAB) 10 MG (21) TBPK tablet    Sig: Take by mouth daily for 6 days. Take 6 tablets on day 1, 5 tablets on day 2, 4 tablets on day 3, 3 tablets on day 4, 2 tablets on day 5, 1 tablet on day 6    Dispense:  21 tablet    Refill:  0    Order Specific Question:   Supervising Provider    Answer:   Merrilee Jansky  . hydrOXYzine (ATARAX/VISTARIL) 25 MG tablet    Sig: Take 1 tablet (25 mg total) by mouth every 6 (six) hours.    Dispense:  12 tablet    Refill:  0    Order Specific Question:   Supervising Provider    Answer:   [id number] Merrilee Jansky  . triamcinolone cream (KENALOG) 0.1 %    Sig: Apply 1 application topically 2 (two) times daily.    Dispense:  30 g    Refill:  0    Order Specific Question:   Supervising Provider    Answer:   [id number] Merrilee Jansky     Stop the Wellbutrin Let your primary care provider know about the rash with Wellbutrin and discuss other options there Solumedrol in office today Hydroxyzine at night for nighttime relief Triamcinolone 0.1% (corticosteroid - itch/ inflammation relief) Prescribed prednisone taper Take as prescribed and to completion Avoid hot showers/ baths Moisturize skin daily  Follow up with PCP if symptoms persists Return or go to the ER if you have any new or worsening symptoms such as fever, chills, nausea, vomiting, redness, swelling, discharge, if symptoms do not improve with  medications  Reviewed expectations re: course of current medical issues. Questions answered. Outlined signs and symptoms indicating need for more acute intervention. Patient verbalized understanding. After Visit Summary given.   [[id number]], NP 08/17/19 [phone]

## 2019-10-04 ENCOUNTER — Other Ambulatory Visit: Payer: BC Managed Care – PPO

## 2019-10-04 ENCOUNTER — Other Ambulatory Visit: Payer: Self-pay

## 2019-10-04 DIAGNOSIS — Z20822 Contact with and (suspected) exposure to covid-19: Secondary | ICD-10-CM

## 2019-10-07 LAB — SARS-COV-2, NAA 2 DAY TAT

## 2019-10-07 LAB — SPECIMEN STATUS REPORT

## 2019-10-07 LAB — NOVEL CORONAVIRUS, NAA: SARS-CoV-2, NAA: NOT DETECTED

## 2020-01-08 ENCOUNTER — Other Ambulatory Visit: Payer: BC Managed Care – PPO

## 2020-05-06 ENCOUNTER — Other Ambulatory Visit: Payer: Self-pay

## 2020-05-06 ENCOUNTER — Encounter: Payer: Self-pay | Admitting: Emergency Medicine

## 2020-05-06 ENCOUNTER — Ambulatory Visit
Admission: EM | Admit: 2020-05-06 | Discharge: 2020-05-06 | Disposition: A | Discharge status: Home / Self Care | Payer: BC Managed Care – PPO | Attending: Emergency Medicine | Admitting: Emergency Medicine

## 2020-05-06 DIAGNOSIS — B029 Zoster without complications: Secondary | ICD-10-CM | POA: Diagnosis not present

## 2020-05-06 DIAGNOSIS — R21 Rash and other nonspecific skin eruption: Secondary | ICD-10-CM

## 2020-05-06 MED ORDER — PREDNISONE 10 MG (21) PO TBPK: ORAL_TABLET | Freq: Every day | ORAL | 0 refills | Status: DC

## 2020-05-06 MED ORDER — VALACYCLOVIR HCL 1 G PO TABS: 1000.0000 mg | ORAL_TABLET | Freq: Three times a day (TID) | ORAL | 0 refills | Status: AC

## 2020-05-06 NOTE — Discharge Instructions (Addendum)
Rest and use ice/heat as needed for symptomatic relief Prescribed valacyclovir 1000mg 3x/day for 10 days Prescribed prednisone taper for inflammation and pain Use OTC medications such as ibuprofen/ tylenol.  Follow up with PCP in 7-10 days if rash is still present Follow up with PCP if symptoms of burning, stinging, tingling or numbness occur after rash resolves, you may need additional treatment Return here or go to ER if you have any new or worsening symptoms (such as eye involvement, severe pain, or signs of secondary infection such as fever, chills, nausea, vomiting, discharge, redness or warmth over site of rash) 

## 2020-05-06 NOTE — ED Provider Notes (Signed)
[facility]   [id number] 05/06/20 Arrival Time: 1614  CC: Rash  SUBJECTIVE:  Olivia Dean is a 30 y.o. female who presents with a rash to mid back x 1 day.  Denies precipitating event or trauma.  Denies changes in soaps, detergents, close contacts with similar rash, known trigger or environmental trigger, allergy. Denies medications change or starting a new medication recently.  Localizes the rash to mid back.  Describes it as painful, itching and burning in character.  Denies alleviating or aggravating factors.  Denies similar symptoms in the past.   Denies fever, chills, nausea, vomiting, erythema, swelling, discharge.  ROS: As per HPI.  All other pertinent ROS negative.     Past Medical History:  Diagnosis Date  . Anxiety    meds 2 years ago  . Asthma    exercise induced  . Depression   . Tachycardia    Past Surgical History:  Procedure Laterality Date  . CESAREAN SECTION N/A 08/06/2014   Procedure: CESAREAN SECTION;  Surgeon: Richarda Overlie, MD;  Location: [facility];  Service: Obstetrics;  Laterality: N/A;  . MYRINGOTOMY     Allergies  Allergen Reactions  . Penicillins Hives  . Wellbutrin [Bupropion]   . Nickel Rash   No current facility-administered medications on file prior to encounter.   Current Outpatient Medications on File Prior to Encounter  Medication Sig Dispense Refill  . ALPRAZolam (XANAX) 0.25 MG tablet 0.25 mg as needed.     . triamcinolone cream (KENALOG) 0.1 % Apply 1 application topically 2 (two) times daily. 30 g 0  . [DISCONTINUED] Sertraline HCl (ZOLOFT PO) Take by mouth.     Social History   Socioeconomic History  . Marital status: Single    Spouse name: Not on file  . Number of children: Not on file  . Years of education: Not on file  . Highest education level: Not on file  Occupational History  . Not on file  Tobacco Use  . Smoking status: Never Smoker  . Smokeless tobacco: Never Used  Substance and Sexual Activity  . Alcohol  use: Yes    Comment: occ  . Drug use: No  . Sexual activity: Yes  Other Topics Concern  . Not on file  Social History Narrative  . Not on file   Social Determinants of Health   Financial Resource Strain: Not on file  Food Insecurity: Not on file  Transportation Needs: Not on file  Physical Activity: Not on file  Stress: Not on file  Social Connections: Not on file  Intimate Partner Violence: Not on file   Family History  Problem Relation Age of Onset  . Heart attack Mother   . Hypertension Mother   . Migraines Maternal Uncle   . Hypertension Maternal Grandmother   . Migraines Maternal Grandfather   . Hypertension Paternal Grandmother   . Diabetes Paternal Grandmother   . Thyroid disease Paternal Grandmother   . Cancer Paternal Grandmother        thyroid    OBJECTIVE: Vitals:   05/06/20 1651  BP: (!) 155/83  Pulse: (!) 110  Resp: 18  Temp: 98.2 F (36.8 C)  TempSrc: Oral  SpO2: 100%    General appearance: alert; no distress Head: NCAT Lungs: normal respiratory effort Extremities: no edema Skin: warm and dry; crops of light pink papules over mid back, mildy TTP, no drainage or bleeding Psychological: alert and cooperative; normal mood and affect  ASSESSMENT & PLAN:  1. Rash and nonspecific  skin eruption   2. Herpes zoster without complication     Meds ordered this encounter  Medications  . valACYclovir (VALTREX) 1000 MG tablet    Sig: Take 1 tablet (1,000 mg total) by mouth 3 (three) times daily for 7 days.    Dispense:  21 tablet    Refill:  0    Order Specific Question:   Supervising Provider    Answer:   Eustace Moore [[id number]]  . predniSONE (STERAPRED UNI-PAK 21 TAB) 10 MG (21) TBPK tablet    Sig: Take by mouth daily. Take 6 tabs by mouth daily  for 2 days, then 5 tabs for 2 days, then 4 tabs for 2 days, then 3 tabs for 2 days, 2 tabs for 2 days, then 1 tab by mouth daily for 2 days    Dispense:  42 tablet    Refill:  0    Order Specific  Question:   Supervising Provider    Answer:   Eustace Moore [[id number]]    @NFU @  Rest and use ice/heat as needed for symptomatic relief Prescribed valacyclovir 1000mg  3x/day for 10 days Prescribed prednisone taper for inflammation and pain Use OTC medications such as ibuprofen/ tylenol.  Follow up with PCP in 7-10 days if rash is still present Follow up with PCP if symptoms of burning, stinging, tingling or numbness occur after rash resolves, you may need additional treatment Return here or go to ER if you have any new or worsening symptoms (such as eye involvement, severe pain, or signs of secondary infection such as fever, chills, nausea, vomiting, discharge, redness or warmth over site of rash)   Reviewed expectations re: course of current medical issues. Questions answered. Outlined signs and symptoms indicating need for more acute intervention. Patient verbalized understanding. After Visit Summary given.   , PA-C 05/06/20 1721

## 2020-05-06 NOTE — ED Triage Notes (Signed)
States back was itching yesterday and she noticed some bumps on her back.

## 2021-10-19 LAB — OB RESULTS CONSOLE ANTIBODY SCREEN: Antibody Screen: NEGATIVE

## 2021-10-19 LAB — OB RESULTS CONSOLE RPR: RPR: NONREACTIVE

## 2021-10-19 LAB — OB RESULTS CONSOLE RUBELLA ANTIBODY, IGM: Rubella: IMMUNE

## 2021-10-19 LAB — OB RESULTS CONSOLE HEPATITIS B SURFACE ANTIGEN: Hepatitis B Surface Ag: NEGATIVE

## 2021-10-19 LAB — HEPATITIS C ANTIBODY: HCV Ab: NEGATIVE

## 2021-10-19 LAB — OB RESULTS CONSOLE HIV ANTIBODY (ROUTINE TESTING): HIV: NONREACTIVE

## 2021-10-19 LAB — OB RESULTS CONSOLE ABO/RH: RH Type: POSITIVE

## 2021-11-03 ENCOUNTER — Encounter: Payer: Self-pay | Admitting: Emergency Medicine

## 2021-11-03 ENCOUNTER — Ambulatory Visit
Admission: RE | Admit: 2021-11-03 | Discharge: 2021-11-03 | Disposition: A | Discharge status: Home / Self Care | Payer: BC Managed Care – PPO | Source: Ambulatory Visit

## 2021-11-03 VITALS — BP 118/69 | HR 100 | Temp 98.1°F | Resp 18

## 2021-11-03 DIAGNOSIS — J309 Allergic rhinitis, unspecified: Secondary | ICD-10-CM

## 2021-11-03 DIAGNOSIS — J029 Acute pharyngitis, unspecified: Secondary | ICD-10-CM | POA: Diagnosis not present

## 2021-11-03 DIAGNOSIS — R059 Cough, unspecified: Secondary | ICD-10-CM

## 2021-11-03 LAB — POCT RAPID STREP A (OFFICE): Rapid Strep A Screen: NEGATIVE

## 2021-11-03 MED ORDER — FLUTICASONE PROPIONATE 50 MCG/ACT NA SUSP: 2.0000 | Freq: Every day | NASAL | 0 refills | Status: AC

## 2021-11-03 NOTE — ED Provider Notes (Signed)
[facility]    CSN: [id number] Arrival date & time: 11/03/21  1528      History   Chief Complaint Chief Complaint  Patient presents with   Sore Throat    Entered by patient    HPI Olivia Dean is a 31 y.o. female.   The history is provided by the patient.   Patient presents with a 4-day history of sore throat, postnasal drainage, sinus pressure and cough.  Patient states that prior to her symptoms starting she went to a bonfire.  Patient states that her throat symptoms feel worse in the morning, but remain persistent throughout the day.  She states that she has a "tickle" in the back of her throat that is causing her to cough.  Patient denies fever, chills, ear pain, headache, wheezing, shortness of breath, chest pain, abdominal pain, nausea, vomiting, or diarrhea.  Patient has been taking Delsym cough syrup and using cough drops for her symptoms.  She reports that she is approximately [redacted] weeks pregnant.  Past Medical History:  Diagnosis Date   Anxiety    meds 2 years ago   Asthma    exercise induced   Depression    Tachycardia     Patient Active Problem List   Diagnosis Date Noted   Anxiety 11/26/2016   Depressive disorder 11/26/2016   Post term pregnancy over 40 weeks 08/06/2014    Past Surgical History:  Procedure Laterality Date   CESAREAN SECTION N/A 08/06/2014   Procedure: CESAREAN SECTION;  Surgeon: Richarda Overlie, MD;  Location: [facility];  Service: Obstetrics;  Laterality: N/A;   MYRINGOTOMY      OB History     Gravida  2   Para  1   Term  1   Preterm      AB      Living  1      SAB      IAB      Ectopic      Multiple  0   Live Births  1            Home Medications    Prior to Admission medications   Medication Sig Start Date End Date Taking? Authorizing Provider  fluticasone (FLONASE) 50 MCG/ACT nasal spray Place 2 sprays into both nostrils daily. 11/03/21  Yes Josaiah Muhammed-Warren, Sadie Haber, NP  Prenatal Vit-Fe  Fumarate-FA (PRENATAL VITAMINS PO) Take by mouth.   Yes [provider]  ALPRAZolam (XANAX) 0.25 MG tablet 0.25 mg as needed.  01/14/18   [provider]  predniSONE (STERAPRED UNI-PAK 21 TAB) 10 MG (21) TBPK tablet Take by mouth daily. Take 6 tabs by mouth daily  for 2 days, then 5 tabs for 2 days, then 4 tabs for 2 days, then 3 tabs for 2 days, 2 tabs for 2 days, then 1 tab by mouth daily for 2 days 05/06/20   Alvino Chapel, [place], PA-C  triamcinolone cream (KENALOG) 0.1 % Apply 1 application topically 2 (two) times daily. 08/17/19   Moshe Cipro, NP  Sertraline HCl (ZOLOFT PO) Take by mouth.  05/06/20  [provider]    Family History Family History  Problem Relation Age of Onset   Heart attack Mother    Hypertension Mother    Migraines Maternal Uncle    Hypertension Maternal Grandmother    Migraines Maternal Grandfather    Hypertension Paternal Grandmother    Diabetes Paternal Grandmother    Thyroid disease Paternal Grandmother    Cancer Paternal Grandmother  thyroid    Social History Social History   Tobacco Use   Smoking status: Never   Smokeless tobacco: Never  Substance Use Topics   Alcohol use: Yes    Comment: occ   Drug use: No     Allergies   Penicillins, Wellbutrin [bupropion], and Nickel   Review of Systems Review of Systems Per HPI  Physical Exam Triage Vital Signs ED Triage Vitals  Enc Vitals Group     BP 11/03/21 1536 118/69     Pulse Rate 11/03/21 1536 100     Resp 11/03/21 1536 18     Temp 11/03/21 1536 98.1 F (36.7 C)     Temp Source 11/03/21 1536 Oral     SpO2 11/03/21 1536 100 %     Weight --      Height --      Head Circumference --      Peak Flow --      Pain Score 11/03/21 1537 8     Pain Loc --      Pain Edu? --      Excl. in [place]? --    No data found.  Updated Vital Signs BP 118/69 (BP Location: Right Arm)   Pulse 100   Temp 98.1 F (36.7 C) (Oral)   Resp 18   SpO2 100%   Visual  Acuity Right Eye Distance:   Left Eye Distance:   Bilateral Distance:    Right Eye Near:   Left Eye Near:    Bilateral Near:     Physical Exam Vitals and nursing note reviewed.  Constitutional:      General: She is not in acute distress.    Appearance: She is well-developed.  HENT:     Head: Normocephalic.     Right Ear: Tympanic membrane and ear canal normal. Tympanic membrane is not erythematous.     Left Ear: Tympanic membrane and ear canal normal. Tympanic membrane is not erythematous.     Nose: Congestion present.     Right Turbinates: Enlarged and swollen.     Left Turbinates: Enlarged and swollen.     Right Sinus: No maxillary sinus tenderness or frontal sinus tenderness.     Left Sinus: No maxillary sinus tenderness or frontal sinus tenderness.     Mouth/Throat:     Lips: Pink.     Pharynx: Uvula midline. Pharyngeal swelling and posterior oropharyngeal erythema present. No uvula swelling.     Tonsils: No tonsillar exudate. 1+ on the right. 1+ on the left.  Eyes:     Conjunctiva/sclera: Conjunctivae normal.     Pupils: Pupils are equal, round, and reactive to light.  Cardiovascular:     Rate and Rhythm: Normal rate and regular rhythm.     Heart sounds: Normal heart sounds.  Pulmonary:     Effort: Pulmonary effort is normal. No respiratory distress.     Breath sounds: Normal breath sounds. No stridor. No wheezing, rhonchi or rales.  Abdominal:     General: Bowel sounds are normal.     Palpations: Abdomen is soft.     Tenderness: There is no abdominal tenderness.  Musculoskeletal:     Cervical back: Normal range of motion.  Lymphadenopathy:     Cervical: No cervical adenopathy.  Skin:    General: Skin is warm and dry.  Neurological:     General: No focal deficit present.     Mental Status: She is alert and oriented to person, place, and time.  Psychiatric:  Mood and Affect: Mood normal.      UC Treatments / Results  Labs (all labs ordered are  listed, but only abnormal results are displayed) Labs Reviewed  POCT RAPID STREP A (OFFICE)    EKG   Radiology No results found.  Procedures Procedures (including critical care time)  Medications Ordered in UC Medications - No data to display  Initial Impression / Assessment and Plan / UC Course  I have reviewed the triage vital signs and the nursing notes.  Pertinent labs & imaging results that were available during my care of the patient were reviewed by me and considered in my medical decision making (see chart for details).  Patient presents for complaints of sore throat, cough, and postnasal drainage that been present for the past 4 days.  Patient is well-appearing, she is in no acute distress, vital signs are stable.  Rapid strep test is negative.  Symptoms are consistent with allergic rhinitis with postnasal drainage most likely causing her cough.  Will treat patient with fluticasone nasal spray to help with her nasal drainage and sinus pressure.  Patient was advised she can take over-the-counter antihistamines that are safe for her pregnancy to include Benadryl, Zyrtec, Claritin, and Xyzal..  Supportive care recommendations were also provided to the patient to include warm salt water gargles, and use of over-the-counter Tylenol as needed.  A throat culture is not indicated at this time based on the patient's symptoms.  Patient verbalizes understanding.  All questions were answered.  Patient is stable for discharge. Final Clinical Impressions(s) / UC Diagnoses   Final diagnoses:  Allergic rhinitis with postnasal drip  Cough, unspecified type     Discharge Instructions      The rapid strep test is negative.   Take medication as directed.  Take over-the-counter  Zyrtec, Benadryl, Claritin, or Xyzal as an antihistamine.  These are safe for you to take during pregnancy. Increase fluids and get plenty of rest. May take over-the-counter Tylenol as needed for pain, fever, or  general discomfort. For your cough, you can take over-the-counter Mucinex (plain), continue Delsym, or Robitussin. Warm salt water gargles 3-4 times daily while symptoms persist.  You can also continue the cough drops as long as they are alcohol free. Recommend normal saline nasal spray to help with nasal congestion throughout the day. For your cough, it may be helpful to use a humidifier at bedtime during sleep and to sleep elevated on 2 pillows while cough symptoms persist. If your symptoms fail to improve within the next 7 to 10 days, follow-up with your primary care physician for further evaluation.     ED Prescriptions     Medication Sig Dispense Auth. Provider   fluticasone (FLONASE) 50 MCG/ACT nasal spray Place 2 sprays into both nostrils daily. 16 g Jarreau Callanan-Warren, Sadie Haber, NP      PDMP not reviewed this encounter.   Abran Cantor, NP 11/03/21 1605

## 2021-11-03 NOTE — ED Triage Notes (Signed)
Sore throat and Right ear pressure since Sunday.  States her throat hurts worse when she coughs

## 2021-11-03 NOTE — Discharge Instructions (Addendum)
The rapid strep test is negative.   Take medication as directed.  Take over-the-counter  Zyrtec, Benadryl, Claritin, or Xyzal as an antihistamine.  These are safe for you to take during pregnancy. Increase fluids and get plenty of rest. May take over-the-counter Tylenol as needed for pain, fever, or general discomfort. For your cough, you can take over-the-counter Mucinex (plain), continue Delsym, or Robitussin. Warm salt water gargles 3-4 times daily while symptoms persist.  You can also continue the cough drops as long as they are alcohol free. Recommend normal saline nasal spray to help with nasal congestion throughout the day. For your cough, it may be helpful to use a humidifier at bedtime during sleep and to sleep elevated on 2 pillows while cough symptoms persist. If your symptoms fail to improve within the next 7 to 10 days, follow-up with your primary care physician for further evaluation.

## 2022-01-16 NOTE — L&D Delivery Note (Signed)
Operative Note  PROCEDURE DATE: 04/21/22   PREOPERATIVE DIAGNOSIS: History of prior C section   POSTOPERATIVE DIAGNOSIS: The same   PROCEDURE:    Repeat Low Transverse Cesarean Section   SURGEON:  Dr. Austin Ioma Chismar ASSISTANT: Dr. Autry-Lott  An experienced assistant was required given the standard of surgical care given the complexity of the case.  This assistant was needed for exposure, dissection, suctioning, retraction, instrument exchange, assisting with delivery with administration of fundal pressure, and for overall help during the procedure.    INDICATIONS: This is a 31 y.o. yo G2P1001 at [redacted]w[redacted]d requiring cesarean section secondary to history of prior C section.   Decision made to proceed with LTCS. The risks of cesarean section discussed with the patient included but were not limited to: bleeding which may require transfusion or reoperation; infection which may require antibiotics; injury to bowel, bladder, ureters or other surrounding organs; injury to the fetus; need for additional procedures including hysterectomy in the event of a life-threatening hemorrhage; placental abnormalities wth subsequent pregnancies, incisional problems, thromboembolic phenomenon and other postoperative/anesthesia complications. The patient agreed with the proposed plan, giving informed consent for the procedure.     FINDINGS:  Viable female infant in vertex presentation, APGARs 9, 9 ,  Weight pending, Amniotic fluid clear,  Intact placenta, three vessel cord.  Grossly normal uterus  .   ANESTHESIA:    Epidural ESTIMATED BLOOD LOSS: 502 cc SPECIMENS: Placenta for labor and delivery COMPLICATIONS: None immediate    PROCEDURE IN DETAIL:  The patient received intravenous antibiotics (Gent/clinda) and had sequential compression devices applied to her lower extremities while in the preoperative area.  She was then taken to the operating room where epidural anesthesia was dosed up to surgical level and was  found to be adequate. She was then placed in a dorsal supine position with a leftward tilt, and prepped and draped in a sterile manner.  A foley catheter was placed into her bladder and attached to constant gravity.  After an adequate timeout was performed, a Pfannenstiel skin incision was made with scalpel and carried through to the underlying layer of fascia. Significant fascial adhesions were noted. The fascia was incised in the midline and this incision was extended bilaterally using the Mayo scissors. Kocher clamps were applied to the superior aspect of the fascial incision and the underlying rectus muscles were dissected off bluntly. A similar process was carried out on the inferior aspect of the facial incision. The rectus muscles were separated in the midline bluntly and the peritoneum was entered bluntly.  A bladder flap was created sharply and developed bluntly. A transverse hysterotomy was made with a scalpel and extended bilaterally bluntly. The bladder blade was then removed. The infant was successfully delivered, and cord was clamped and cut and infant was handed over to awaiting neonatology team. Uterine massage was then administered and the placenta delivered intact with three-vessel cord. The uterus was cleared of clot and debris.  The hysterotomy was closed with 0 vicryl.  A second imbricating suture of 0-vicryl was used to reinforce the incision and aid in hemostasis.The fascia was closed with 0-Vicryl in a running fashion with good restoration of anatomy.  The subcutaneus tissue was irrigated and was reapproximated using running plain gut.  The skin was closed with 4-0 Vicryl in a subcuticular fashion.  All surgical site and was hemostatic at end of procedure without any further bleeding on exam.    Pt tolerated the procedure well. All sponge/lap/needle counts were correct    X 2. Pt taken to recovery room in stable condition.     Nilda Simmer MD

## 2022-01-29 ENCOUNTER — Ambulatory Visit
Admission: EM | Admit: 2022-01-29 | Discharge: 2022-01-29 | Disposition: A | Discharge status: Home / Self Care | Payer: BC Managed Care – PPO | Attending: Family Medicine | Admitting: Family Medicine

## 2022-01-29 DIAGNOSIS — Z1152 Encounter for screening for COVID-19: Secondary | ICD-10-CM | POA: Diagnosis not present

## 2022-01-29 DIAGNOSIS — J069 Acute upper respiratory infection, unspecified: Secondary | ICD-10-CM | POA: Diagnosis not present

## 2022-01-29 NOTE — Discharge Instructions (Signed)
Stop the antibiotic as your infection is viral, not bacterial and this is unnecessary.  You should take Flonase twice daily, use warm saline sinus rinses several times daily as needed, take Sudafed as needed and you may take Delsym, Cepacol lozenges.  Drink plenty of fluids, get lots of rest.  It can take at least 7 to 10 days to get over a viral illness, often longer when pregnant given the immunosuppression of pregnancy.  Follow-up if you start having issues breathing or are unable to keep down fluids for any reason.  Your COVID test should be available tomorrow

## 2022-01-29 NOTE — ED Triage Notes (Signed)
Pt states sore throat,cough,runny nose seen at urgent care 2 days ago is on antibiotics and not feeling any better. Had two negative home Covid tests  Pt is [redacted] weeks pregnant.

## 2022-01-31 LAB — SARS CORONAVIRUS 2 (TAT 6-24 HRS): SARS Coronavirus 2: NEGATIVE

## 2022-02-01 NOTE — ED Provider Notes (Signed)
[facility]    CSN: [id number] Arrival date & time: 01/29/22  1553      History   Chief Complaint Chief Complaint  Patient presents with   Sore Throat    HPI Olivia Dean is a 32 y.o. female.   Patient presenting today with 2-day history of sore throat, cough, runny nose, sneezing, fatigue.  Denies fever, chills, body aches, chest pain, shortness of breath, abdominal pain, nausea vomiting or diarrhea.  Was seen at a different urgent care 2 days ago on day of onset, states she was not tested for any viral illnesses, diagnosed with bronchitis and given a course of antibiotics.  She states she has been taking them and not improving, possibly even getting worse.  Otherwise not trying anything over-the-counter for symptoms.  Of note, she is [redacted] weeks pregnant.    Past Medical History:  Diagnosis Date   Anxiety    meds 2 years ago   Asthma    exercise induced   Depression    Tachycardia     Patient Active Problem List   Diagnosis Date Noted   Anxiety 11/26/2016   Depressive disorder 11/26/2016   Post term pregnancy over 40 weeks 08/06/2014    Past Surgical History:  Procedure Laterality Date   CESAREAN SECTION N/A 08/06/2014   Procedure: CESAREAN SECTION;  Surgeon: Molli Posey, MD;  Location: [facility];  Service: Obstetrics;  Laterality: N/A;   MYRINGOTOMY      OB History     Gravida  2   Para  1   Term  1   Preterm      AB      Living  1      SAB      IAB      Ectopic      Multiple  0   Live Births  1            Home Medications    Prior to Admission medications   Medication Sig Start Date End Date Taking? Authorizing Provider  ALPRAZolam (XANAX) 0.25 MG tablet 0.25 mg as needed.  01/14/18   [provider]  fluticasone (FLONASE) 50 MCG/ACT nasal spray Place 2 sprays into both nostrils daily. 11/03/21   Leath-Warren, Alda Lea, NP  predniSONE (STERAPRED UNI-PAK 21 TAB) 10 MG (21) TBPK tablet Take by mouth daily.  Take 6 tabs by mouth daily  for 2 days, then 5 tabs for 2 days, then 4 tabs for 2 days, then 3 tabs for 2 days, 2 tabs for 2 days, then 1 tab by mouth daily for 2 days 05/06/20   Stacey Drain, [place], PA-C  Prenatal Vit-Fe Fumarate-FA (PRENATAL VITAMINS PO) Take by mouth.    [provider]  triamcinolone cream (KENALOG) 0.1 % Apply 1 application topically 2 (two) times daily. 08/17/19   Faustino Congress, NP  Sertraline HCl (ZOLOFT PO) Take by mouth.  05/06/20  [provider]    Family History Family History  Problem Relation Age of Onset   Heart attack Mother    Hypertension Mother    Migraines Maternal Uncle    Hypertension Maternal Grandmother    Migraines Maternal Grandfather    Hypertension Paternal Grandmother    Diabetes Paternal Grandmother    Thyroid disease Paternal Grandmother    Cancer Paternal Grandmother        thyroid    Social History Social History   Tobacco Use   Smoking status: Never   Smokeless tobacco: Never  Substance Use Topics   Alcohol use: Yes    Comment: occ   Drug use: No     Allergies   Penicillins, Wellbutrin [bupropion], and Nickel   Review of Systems Review of Systems Per HPI  Physical Exam Triage Vital Signs ED Triage Vitals  Enc Vitals Group     BP 01/29/22 1601 120/79     Pulse Rate 01/29/22 1601 (!) 101     Resp 01/29/22 1601 16     Temp 01/29/22 1601 98.3 F (36.8 C)     Temp Source 01/29/22 1601 Oral     SpO2 01/29/22 1601 99 %     Weight --      Height --      Head Circumference --      Peak Flow --      Pain Score 01/29/22 1559 0     Pain Loc --      Pain Edu? --      Excl. in GC? --    No data found.  Updated Vital Signs BP 120/79 (BP Location: Right Arm)   Pulse (!) 101   Temp 98.3 F (36.8 C) (Oral)   Resp 16   SpO2 99%   Visual Acuity Right Eye Distance:   Left Eye Distance:   Bilateral Distance:    Right Eye Near:   Left Eye Near:    Bilateral Near:     Physical Exam Vitals and  nursing note reviewed.  Constitutional:      Appearance: Normal appearance.  HENT:     Head: Atraumatic.     Right Ear: Tympanic membrane and external ear normal.     Left Ear: Tympanic membrane and external ear normal.     Nose: Rhinorrhea present.     Mouth/Throat:     Mouth: Mucous membranes are moist.     Pharynx: Posterior oropharyngeal erythema present.  Eyes:     Extraocular Movements: Extraocular movements intact.     Conjunctiva/sclera: Conjunctivae normal.  Cardiovascular:     Rate and Rhythm: Normal rate and regular rhythm.     Heart sounds: Normal heart sounds.  Pulmonary:     Effort: Pulmonary effort is normal.     Breath sounds: Normal breath sounds. No wheezing or rales.  Musculoskeletal:        General: Normal range of motion.     Cervical back: Normal range of motion and neck supple.  Skin:    General: Skin is warm and dry.  Neurological:     Mental Status: She is alert and oriented to person, place, and time.     Motor: No weakness.     Gait: Gait normal.  Psychiatric:        Mood and Affect: Mood normal.        Thought Content: Thought content normal.      UC Treatments / Results  Labs (all labs ordered are listed, but only abnormal results are displayed) Labs Reviewed  SARS CORONAVIRUS 2 (TAT 6-24 HRS)    EKG   Radiology No results found.  Procedures Procedures (including critical care time)  Medications Ordered in UC Medications - No data to display  Initial Impression / Assessment and Plan / UC Course  I have reviewed the triage vital signs and the nursing notes.  Pertinent labs & imaging results that were available during my care of the patient were reviewed by me and considered in my medical decision making (see chart for details).  Consistent with viral upper respiratory infection, discussed with patient that she can discontinue the antibiotics as they are unnecessary.  She is concerned about COVID-19 as she is pregnant so  COVID testing pending, will treat with supportive over-the-counter medications that are safe in pregnancy such as Delsym, Sudafed, Cepacol lozenges, Flonase, saline sinus rinses, humidifiers.  Discussed supportive care and return precautions.  Final Clinical Impressions(s) / UC Diagnoses   Final diagnoses:  Viral URI with cough     Discharge Instructions      Stop the antibiotic as your infection is viral, not bacterial and this is unnecessary.  You should take Flonase twice daily, use warm saline sinus rinses several times daily as needed, take Sudafed as needed and you may take Delsym, Cepacol lozenges.  Drink plenty of fluids, get lots of rest.  It can take at least 7 to 10 days to get over a viral illness, often longer when pregnant given the immunosuppression of pregnancy.  Follow-up if you start having issues breathing or are unable to keep down fluids for any reason.  Your COVID test should be available tomorrow    ED Prescriptions   None    PDMP not reviewed this encounter.   Volney American, [place] 02/01/22 1307

## 2022-03-03 ENCOUNTER — Encounter (HOSPITAL_COMMUNITY): Payer: Self-pay | Admitting: Obstetrics and Gynecology

## 2022-03-03 ENCOUNTER — Inpatient Hospital Stay (HOSPITAL_COMMUNITY)
Admission: AD | Admit: 2022-03-03 | Discharge: 2022-03-03 | Disposition: A | Discharge status: Home / Self Care | Payer: BC Managed Care – PPO | Attending: Obstetrics and Gynecology | Admitting: Obstetrics and Gynecology

## 2022-03-03 DIAGNOSIS — O98513 Other viral diseases complicating pregnancy, third trimester: Secondary | ICD-10-CM | POA: Insufficient documentation

## 2022-03-03 DIAGNOSIS — A084 Viral intestinal infection, unspecified: Secondary | ICD-10-CM | POA: Insufficient documentation

## 2022-03-03 DIAGNOSIS — Z3A33 33 weeks gestation of pregnancy: Secondary | ICD-10-CM | POA: Insufficient documentation

## 2022-03-03 LAB — URINALYSIS, ROUTINE W REFLEX MICROSCOPIC
Bilirubin Urine: NEGATIVE
Glucose, UA: NEGATIVE mg/dL
Hgb urine dipstick: NEGATIVE
Ketones, ur: 80 mg/dL — AB
Leukocytes,Ua: NEGATIVE
Nitrite: NEGATIVE
Protein, ur: NEGATIVE mg/dL
Specific Gravity, Urine: 1.025 (ref 1.005–1.030)
pH: 6 (ref 5.0–8.0)

## 2022-03-03 MED ORDER — PROMETHAZINE HCL 25 MG PO TABS: 25.0000 mg | ORAL_TABLET | Freq: Four times a day (QID) | ORAL | 0 refills | Status: DC | PRN

## 2022-03-03 MED ORDER — METOCLOPRAMIDE HCL 10 MG PO TABS: 10.0000 mg | ORAL_TABLET | Freq: Four times a day (QID) | ORAL | 0 refills | Status: DC

## 2022-03-03 MED ORDER — LACTATED RINGERS IV BOLUS
1000.0000 mL | Freq: Once | INTRAVENOUS | Status: AC
  Administered 2022-03-03: 1000 mL via INTRAVENOUS

## 2022-03-03 MED ORDER — METOCLOPRAMIDE HCL 5 MG/ML IJ SOLN
10.0000 mg | Freq: Once | INTRAMUSCULAR | Status: AC
  Administered 2022-03-03: 10 mg via INTRAVENOUS
  Filled 2022-03-03: qty 2

## 2022-03-03 MED ORDER — SCOPOLAMINE 1 MG/3DAYS TD PT72
1.0000 | MEDICATED_PATCH | TRANSDERMAL | Status: DC
  Administered 2022-03-03: 1.5 mg via TRANSDERMAL
  Filled 2022-03-03: qty 1

## 2022-03-03 MED ORDER — ONDANSETRON HCL 4 MG/2ML IJ SOLN
4.0000 mg | Freq: Once | INTRAMUSCULAR | Status: AC
  Administered 2022-03-03: 4 mg via INTRAVENOUS
  Filled 2022-03-03: qty 2

## 2022-03-03 MED ORDER — ONDANSETRON 8 MG PO TBDP: 8.0000 mg | ORAL_TABLET | Freq: Three times a day (TID) | ORAL | 0 refills | Status: DC | PRN

## 2022-03-03 MED ORDER — FAMOTIDINE IN NACL 20-0.9 MG/50ML-% IV SOLN
20.0000 mg | Freq: Once | INTRAVENOUS | Status: AC
  Administered 2022-03-03: 20 mg via INTRAVENOUS
  Filled 2022-03-03: qty 50

## 2022-03-03 NOTE — MAU Provider Note (Signed)
History     CSN: [id number]  Arrival date and time: 03/03/22 0947   Event Date/Time   First Provider Initiated Contact with Patient 03/03/22 1016      Chief Complaint  Patient presents with   Emesis   Nausea   Diarrhea   Abdominal Pain   HPI  Olivia Dean is a 32 y.o. G2P1001 at 6w0dwho presents for evaluation of nausea, vomiting and diarrhea. 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 She states she thinks she has a stomach bug. She reports she has  been vomiting non stop since 2330 last night. She reports 2 episodes of diarrhea today. She has not taken anything for her symptoms. She denies any pain. She denies any vaginal bleeding, discharge, and leaking of fluid. Denies any constipation, diarrhea or any urinary complaints. Reports normal fetal movement.   OB History     Gravida  2   Para  1   Term  1   Preterm      AB      Living  1      SAB      IAB      Ectopic      Multiple  0   Live Births  1           Past Medical History:  Diagnosis Date   Anxiety    meds 2 years ago   Asthma    exercise induced   Depression    Tachycardia     Past Surgical History:  Procedure Laterality Date   CESAREAN SECTION N/A 08/06/2014   Procedure: CESAREAN SECTION;  Surgeon: RMolli Posey MD;  [facility];  Service: Obstetrics;  Laterality: N/A;   MYRINGOTOMY      Family History  Problem Relation Age of Onset   Heart attack Mother    Hypertension Mother    Migraines Maternal Uncle    Hypertension Maternal Grandmother    Migraines Maternal Grandfather    Hypertension Paternal Grandmother    Diabetes Paternal Grandmother    Thyroid disease Paternal Grandmother    Cancer Paternal Grandmother        thyroid    Social History   Tobacco Use   Smoking status: Never   Smokeless tobacco: Never  Substance Use Topics   Alcohol use: Yes    Comment: occ   Drug use: No    Allergies:  Allergies  Allergen  Reactions   Penicillins Hives   Wellbutrin [Bupropion]    Nickel Rash    No medications prior to admission.    Review of Systems  Constitutional: Negative.  Negative for fatigue and fever.  HENT: Negative.    Respiratory: Negative.  Negative for shortness of breath.   Cardiovascular: Negative.  Negative for chest pain.  Gastrointestinal:  Positive for diarrhea, nausea and vomiting. Negative for abdominal pain and constipation.  Genitourinary: Negative.  Negative for dysuria, vaginal bleeding and vaginal discharge.  Neurological: Negative.  Negative for dizziness and headaches.   Physical Exam   Blood pressure 110/66, pulse 94, temperature 98.6 F (37 C), temperature source Oral, resp. rate 16, SpO2 98 %, unknown if currently breastfeeding.  Patient Vitals for the past 24 hrs:  BP Temp Temp src Pulse Resp SpO2  03/03/22 1146 110/66 -- -- 94 -- --  03/03/22 1011 111/66 98.6 F (37 C) Oral (!) 117 16 98 %    Physical Exam Vitals and nursing note reviewed.  Constitutional:  General: She is not in acute distress.    Appearance: She is well-developed.  HENT:     Head: Normocephalic.  Eyes:     Pupils: Pupils are equal, round, and reactive to light.  Cardiovascular:     Rate and Rhythm: Normal rate and regular rhythm.     Heart sounds: Normal heart sounds.  Pulmonary:     Effort: Pulmonary effort is normal. No respiratory distress.     Breath sounds: Normal breath sounds.  Abdominal:     General: Bowel sounds are normal. There is no distension.     Palpations: Abdomen is soft.     Tenderness: There is no abdominal tenderness.  Skin:    General: Skin is warm and dry.  Neurological:     Mental Status: She is alert and oriented to person, place, and time.  Psychiatric:        Mood and Affect: Mood normal.        Behavior: Behavior normal.        Thought Content: Thought content normal.        Judgment: Judgment normal.     Fetal Tracing:  Baseline:  130 Variability: moderate Accels: 15x15 Decels: none  Toco: UI with occasional uc's  MAU Course  Procedures  Results for orders placed or performed during the hospital encounter of 03/03/22 (from the past 24 hour(s))  Urinalysis, Routine w reflex microscopic -Urine, Clean Catch     Status: Abnormal   Collection Time: 03/03/22 10:12 AM  Result Value Ref Range   Color, Urine YELLOW YELLOW   APPearance CLEAR CLEAR   Specific Gravity, Urine 1.025 1.005 - 1.030   pH 6.0 5.0 - 8.0   Glucose, UA NEGATIVE NEGATIVE mg/dL   Hgb urine dipstick NEGATIVE NEGATIVE   Bilirubin Urine NEGATIVE NEGATIVE   Ketones, ur 80 (A) NEGATIVE mg/dL   Protein, ur NEGATIVE NEGATIVE mg/dL   Nitrite NEGATIVE NEGATIVE   Leukocytes,Ua NEGATIVE NEGATIVE     MDM Prenatal records from community office reviewed. Pregnancy complicated by hx of cesarean section, planning repeat  Labs ordered and reviewed.   UA LR Bolus Zofran Scop patch Reglan  Pepcid  Patient reports improvement of symptoms and tolerated PO  Assessment and Plan   1. Viral gastroenteritis   2. [redacted] weeks gestation of pregnancy     -Discharge home in stable condition -Rx for reglan, zofran, phenergan and scop patches sent to pharmacy -Viral GI precautions discussed -Patient advised to follow-up with OB as scheduled for prenatal care -Patient may return to MAU as needed or if her condition were to change or worsen  Wende Mott, CNM 03/03/2022, 10:16 AM

## 2022-03-03 NOTE — MAU Note (Signed)
.  Olivia Dean is a 32 y.o. at 23w0dhere in MAU reporting: since 2330 last night she has had "non stop vomiting" has also had x2 episodes of diarrhea. 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 +FM. Does endorse abdominal discomfort, but unable to describe, states that it feels "uneasy".   Pain score: unable to rate Lab orders placed from triage:  UA

## 2022-03-03 NOTE — Discharge Instructions (Signed)

## 2022-03-29 LAB — OB RESULTS CONSOLE GBS: GBS: NEGATIVE

## 2022-04-07 ENCOUNTER — Encounter (HOSPITAL_COMMUNITY): Payer: Self-pay | Admitting: *Deleted

## 2022-04-07 NOTE — Patient Instructions (Signed)
Olivia Dean  04/07/2022   Your procedure is scheduled on:  04/21/2022  Arrive at 0800 at Entrance C on [address] at [facility]  and [address]. You are invited to use the FREE valet parking or use the Visitor's parking deck.  Pick up the phone at the desk and dial [phone].  Call this number if you have problems the morning of surgery: [phone]  Remember:   Do not eat food:(After Midnight) Desps de medianoche.  Do not drink clear liquids: (After Midnight) Desps de medianoche.  Take these medicines the morning of surgery with A SIP OF WATER:  none   Do not wear jewelry, make-up or nail polish.  Do not wear lotions, powders, or perfumes. Do not wear deodorant.  Do not shave 48 hours prior to surgery.  Do not bring valuables to the hospital.  [facility] is not   responsible for any belongings or valuables brought to the hospital.  Contacts, dentures or bridgework may not be worn into surgery.  Leave suitcase in the car. After surgery it may be brought to your room.  For patients admitted to the hospital, checkout time is 11:00 AM the day of              discharge.      Please read over the following fact sheets that you were given:     Preparing for Surgery

## 2022-04-10 ENCOUNTER — Encounter (HOSPITAL_COMMUNITY): Payer: Self-pay

## 2022-04-19 ENCOUNTER — Other Ambulatory Visit (HOSPITAL_COMMUNITY)
Admission: RE | Admit: 2022-04-19 | Discharge: 2022-04-19 | Disposition: A | Discharge status: Home / Self Care | Payer: BC Managed Care – PPO | Source: Ambulatory Visit | Attending: Obstetrics and Gynecology | Admitting: Obstetrics and Gynecology

## 2022-04-19 DIAGNOSIS — Z01812 Encounter for preprocedural laboratory examination: Secondary | ICD-10-CM | POA: Insufficient documentation

## 2022-04-19 LAB — CBC
HCT: 35.8 % — ABNORMAL LOW (ref 36.0–46.0)
Hemoglobin: 11.2 g/dL — ABNORMAL LOW (ref 12.0–15.0)
MCH: 26.7 pg (ref 26.0–34.0)
MCHC: 31.3 g/dL (ref 30.0–36.0)
MCV: 85.2 fL (ref 80.0–100.0)
Platelets: 175 10*3/uL (ref 150–400)
RBC: 4.2 MIL/uL (ref 3.87–5.11)
RDW: 13.5 % (ref 11.5–15.5)
WBC: 12.2 10*3/uL — ABNORMAL HIGH (ref 4.0–10.5)
nRBC: 0 % (ref 0.0–0.2)

## 2022-04-19 LAB — TYPE AND SCREEN
ABO/RH(D): O POS
Antibody Screen: NEGATIVE

## 2022-04-19 LAB — RPR: RPR Ser Ql: NONREACTIVE

## 2022-04-21 ENCOUNTER — Inpatient Hospital Stay (HOSPITAL_COMMUNITY): Payer: BC Managed Care – PPO | Admitting: Anesthesiology

## 2022-04-21 ENCOUNTER — Other Ambulatory Visit: Payer: Self-pay

## 2022-04-21 ENCOUNTER — Encounter (HOSPITAL_COMMUNITY)
Admission: RE | Disposition: A | Discharge status: Home / Self Care | Payer: Self-pay | Source: Ambulatory Visit | Attending: Obstetrics and Gynecology

## 2022-04-21 ENCOUNTER — Encounter (HOSPITAL_COMMUNITY): Payer: Self-pay | Admitting: Obstetrics and Gynecology

## 2022-04-21 ENCOUNTER — Inpatient Hospital Stay (HOSPITAL_COMMUNITY)
Admission: RE | Admit: 2022-04-21 | Discharge: 2022-04-23 | DRG: 787 | Disposition: A | Discharge status: Home / Self Care | Payer: BC Managed Care – PPO | Source: Ambulatory Visit | Attending: Obstetrics and Gynecology | Admitting: Obstetrics and Gynecology

## 2022-04-21 DIAGNOSIS — Z880 Allergy status to penicillin: Secondary | ICD-10-CM | POA: Diagnosis not present

## 2022-04-21 DIAGNOSIS — Z3A40 40 weeks gestation of pregnancy: Secondary | ICD-10-CM | POA: Diagnosis not present

## 2022-04-21 DIAGNOSIS — D62 Acute posthemorrhagic anemia: Secondary | ICD-10-CM | POA: Diagnosis not present

## 2022-04-21 DIAGNOSIS — O34211 Maternal care for low transverse scar from previous cesarean delivery: Secondary | ICD-10-CM | POA: Diagnosis present

## 2022-04-21 DIAGNOSIS — Z3490 Encounter for supervision of normal pregnancy, unspecified, unspecified trimester: Principal | ICD-10-CM

## 2022-04-21 DIAGNOSIS — O9902 Anemia complicating childbirth: Secondary | ICD-10-CM | POA: Diagnosis present

## 2022-04-21 LAB — HIV ANTIBODY (ROUTINE TESTING W REFLEX): HIV Screen 4th Generation wRfx: NONREACTIVE

## 2022-04-21 SURGERY — Surgical Case
Anesthesia: Spinal

## 2022-04-21 MED ORDER — SIMETHICONE 80 MG PO CHEW
80.0000 mg | CHEWABLE_TABLET | Freq: Three times a day (TID) | ORAL | Status: DC
  Administered 2022-04-21 – 2022-04-23 (×5): 80 mg via ORAL
  Filled 2022-04-21 (×5): qty 1

## 2022-04-21 MED ORDER — LACTATED RINGERS IV SOLN: INTRAVENOUS | Status: DC

## 2022-04-21 MED ORDER — WITCH HAZEL-GLYCERIN EX PADS: 1.0000 | MEDICATED_PAD | CUTANEOUS | Status: DC | PRN

## 2022-04-21 MED ORDER — STERILE WATER FOR IRRIGATION IR SOLN
Status: DC | PRN
  Administered 2022-04-21: 1

## 2022-04-21 MED ORDER — MENTHOL 3 MG MT LOZG: 1.0000 | LOZENGE | OROMUCOSAL | Status: DC | PRN

## 2022-04-21 MED ORDER — SENNOSIDES-DOCUSATE SODIUM 8.6-50 MG PO TABS
2.0000 | ORAL_TABLET | Freq: Every day | ORAL | Status: DC
  Administered 2022-04-22 – 2022-04-23 (×2): 2 via ORAL
  Filled 2022-04-21 (×2): qty 2

## 2022-04-21 MED ORDER — ONDANSETRON HCL 4 MG/2ML IJ SOLN
INTRAMUSCULAR | Status: DC | PRN
  Administered 2022-04-21: 4 mg via INTRAVENOUS

## 2022-04-21 MED ORDER — SIMETHICONE 80 MG PO CHEW: 80.0000 mg | CHEWABLE_TABLET | ORAL | Status: DC | PRN

## 2022-04-21 MED ORDER — SOD CITRATE-CITRIC ACID 500-334 MG/5ML PO SOLN
30.0000 mL | ORAL | Status: AC
  Administered 2022-04-21: 30 mL via ORAL

## 2022-04-21 MED ORDER — SODIUM CHLORIDE 0.9% FLUSH: 3.0000 mL | INTRAVENOUS | Status: DC | PRN

## 2022-04-21 MED ORDER — FENTANYL CITRATE (PF) 100 MCG/2ML IJ SOLN: 25.0000 ug | INTRAMUSCULAR | Status: DC | PRN

## 2022-04-21 MED ORDER — CLINDAMYCIN PHOSPHATE 900 MG/50ML IV SOLN
INTRAVENOUS | Status: AC
  Filled 2022-04-21: qty 50

## 2022-04-21 MED ORDER — DEXAMETHASONE SODIUM PHOSPHATE 10 MG/ML IJ SOLN
INTRAMUSCULAR | Status: DC | PRN
  Administered 2022-04-21: 10 mg via INTRAVENOUS

## 2022-04-21 MED ORDER — OXYCODONE HCL 5 MG PO TABS
5.0000 mg | ORAL_TABLET | ORAL | Status: DC | PRN
  Administered 2022-04-22 – 2022-04-23 (×2): 10 mg via ORAL
  Filled 2022-04-21 (×2): qty 2

## 2022-04-21 MED ORDER — ONDANSETRON HCL 4 MG/2ML IJ SOLN: 4.0000 mg | Freq: Three times a day (TID) | INTRAMUSCULAR | Status: DC | PRN

## 2022-04-21 MED ORDER — DIPHENHYDRAMINE HCL 25 MG PO CAPS: 25.0000 mg | ORAL_CAPSULE | Freq: Four times a day (QID) | ORAL | Status: DC | PRN

## 2022-04-21 MED ORDER — ACETAMINOPHEN 325 MG PO TABS
650.0000 mg | ORAL_TABLET | ORAL | Status: DC | PRN
  Administered 2022-04-22 – 2022-04-23 (×3): 650 mg via ORAL
  Filled 2022-04-21 (×3): qty 2

## 2022-04-21 MED ORDER — NALOXONE HCL 0.4 MG/ML IJ SOLN: 0.4000 mg | INTRAMUSCULAR | Status: DC | PRN

## 2022-04-21 MED ORDER — COCONUT OIL OIL: 1.0000 | TOPICAL_OIL | Status: DC | PRN

## 2022-04-21 MED ORDER — SCOPOLAMINE 1 MG/3DAYS TD PT72
1.0000 | MEDICATED_PATCH | Freq: Once | TRANSDERMAL | Status: DC
  Administered 2022-04-21: 1.5 mg via TRANSDERMAL

## 2022-04-21 MED ORDER — ZOLPIDEM TARTRATE 5 MG PO TABS: 5.0000 mg | ORAL_TABLET | Freq: Every evening | ORAL | Status: DC | PRN

## 2022-04-21 MED ORDER — PHENYLEPHRINE HCL-NACL 20-0.9 MG/250ML-% IV SOLN
INTRAVENOUS | Status: AC
  Filled 2022-04-21: qty 250

## 2022-04-21 MED ORDER — ACETAMINOPHEN 500 MG PO TABS
1000.0000 mg | ORAL_TABLET | Freq: Four times a day (QID) | ORAL | Status: AC
  Administered 2022-04-21 – 2022-04-22 (×3): 1000 mg via ORAL
  Filled 2022-04-21 (×3): qty 2

## 2022-04-21 MED ORDER — DOCUSATE SODIUM 100 MG PO CAPS
100.0000 mg | ORAL_CAPSULE | Freq: Every day | ORAL | Status: DC
  Administered 2022-04-21 – 2022-04-23 (×3): 100 mg via ORAL
  Filled 2022-04-21 (×3): qty 1

## 2022-04-21 MED ORDER — NALOXONE HCL 4 MG/10ML IJ SOLN: 1.0000 ug/kg/h | INTRAVENOUS | Status: DC | PRN

## 2022-04-21 MED ORDER — IBUPROFEN 600 MG PO TABS
600.0000 mg | ORAL_TABLET | Freq: Four times a day (QID) | ORAL | Status: DC
  Administered 2022-04-21 – 2022-04-23 (×8): 600 mg via ORAL
  Filled 2022-04-21 (×8): qty 1

## 2022-04-21 MED ORDER — KETOROLAC TROMETHAMINE 30 MG/ML IJ SOLN: 30.0000 mg | Freq: Four times a day (QID) | INTRAMUSCULAR | Status: DC | PRN

## 2022-04-21 MED ORDER — DEXAMETHASONE SODIUM PHOSPHATE 10 MG/ML IJ SOLN
INTRAMUSCULAR | Status: AC
  Filled 2022-04-21: qty 2

## 2022-04-21 MED ORDER — MORPHINE SULFATE (PF) 0.5 MG/ML IJ SOLN
INTRAMUSCULAR | Status: DC | PRN
  Administered 2022-04-21: .15 mg via INTRATHECAL

## 2022-04-21 MED ORDER — KETOROLAC TROMETHAMINE 30 MG/ML IJ SOLN
30.0000 mg | Freq: Four times a day (QID) | INTRAMUSCULAR | Status: DC | PRN
  Administered 2022-04-21: 30 mg via INTRAVENOUS

## 2022-04-21 MED ORDER — GENTAMICIN SULFATE 40 MG/ML IJ SOLN
5.0000 mg/kg | Freq: Once | INTRAVENOUS | Status: AC
  Administered 2022-04-21: 420 mg via INTRAVENOUS
  Filled 2022-04-21: qty 10.5

## 2022-04-21 MED ORDER — PRENATAL MULTIVITAMIN CH
1.0000 | ORAL_TABLET | Freq: Every day | ORAL | Status: DC
  Administered 2022-04-21 – 2022-04-23 (×3): 1 via ORAL
  Filled 2022-04-21 (×3): qty 1

## 2022-04-21 MED ORDER — ACETAMINOPHEN 10 MG/ML IV SOLN
INTRAVENOUS | Status: DC | PRN
  Administered 2022-04-21: 1000 mg via INTRAVENOUS

## 2022-04-21 MED ORDER — KETOROLAC TROMETHAMINE 30 MG/ML IJ SOLN
INTRAMUSCULAR | Status: AC
  Filled 2022-04-21: qty 1

## 2022-04-21 MED ORDER — SOD CITRATE-CITRIC ACID 500-334 MG/5ML PO SOLN
ORAL | Status: AC
  Filled 2022-04-21: qty 30

## 2022-04-21 MED ORDER — DEXMEDETOMIDINE HCL IN NACL 80 MCG/20ML IV SOLN
INTRAVENOUS | Status: DC | PRN
  Administered 2022-04-21 (×2): 1 mL via BUCCAL

## 2022-04-21 MED ORDER — MEPERIDINE HCL 25 MG/ML IJ SOLN: 6.2500 mg | INTRAMUSCULAR | Status: DC | PRN

## 2022-04-21 MED ORDER — FENTANYL CITRATE (PF) 100 MCG/2ML IJ SOLN
INTRAMUSCULAR | Status: AC
  Filled 2022-04-21: qty 2

## 2022-04-21 MED ORDER — FENTANYL CITRATE (PF) 100 MCG/2ML IJ SOLN
INTRAMUSCULAR | Status: DC | PRN
  Administered 2022-04-21: 15 ug via INTRATHECAL

## 2022-04-21 MED ORDER — DIBUCAINE (PERIANAL) 1 % EX OINT: 1.0000 | TOPICAL_OINTMENT | CUTANEOUS | Status: DC | PRN

## 2022-04-21 MED ORDER — OXYTOCIN-SODIUM CHLORIDE 30-0.9 UT/500ML-% IV SOLN
2.5000 [IU]/h | INTRAVENOUS | Status: AC
  Administered 2022-04-21: 2.5 [IU]/h via INTRAVENOUS
  Filled 2022-04-21: qty 500

## 2022-04-21 MED ORDER — PHENYLEPHRINE HCL-NACL 20-0.9 MG/250ML-% IV SOLN
INTRAVENOUS | Status: DC | PRN
  Administered 2022-04-21: 60 ug/min via INTRAVENOUS

## 2022-04-21 MED ORDER — MORPHINE SULFATE (PF) 0.5 MG/ML IJ SOLN
INTRAMUSCULAR | Status: AC
  Filled 2022-04-21: qty 10

## 2022-04-21 MED ORDER — ONDANSETRON HCL 4 MG/2ML IJ SOLN
INTRAMUSCULAR | Status: AC
  Filled 2022-04-21: qty 2

## 2022-04-21 MED ORDER — CLINDAMYCIN PHOSPHATE 900 MG/50ML IV SOLN
900.0000 mg | Freq: Once | INTRAVENOUS | Status: AC
  Administered 2022-04-21: 900 mg via INTRAVENOUS

## 2022-04-21 MED ORDER — SCOPOLAMINE 1 MG/3DAYS TD PT72
MEDICATED_PATCH | TRANSDERMAL | Status: AC
  Filled 2022-04-21: qty 1

## 2022-04-21 MED ORDER — OXYTOCIN-SODIUM CHLORIDE 30-0.9 UT/500ML-% IV SOLN
INTRAVENOUS | Status: DC | PRN
  Administered 2022-04-21: 300 mL via INTRAVENOUS

## 2022-04-21 MED ORDER — BUPIVACAINE IN DEXTROSE 0.75-8.25 % IT SOLN
INTRATHECAL | Status: DC | PRN
  Administered 2022-04-21: 1.6 mL via INTRATHECAL

## 2022-04-21 SURGICAL SUPPLY — 33 items
APL PRP STRL LF DISP 70% ISPRP (MISCELLANEOUS) ×2
APL SKNCLS STERI-STRIP NONHPOA (GAUZE/BANDAGES/DRESSINGS) ×2
BENZOIN TINCTURE PRP APPL 2/3 (GAUZE/BANDAGES/DRESSINGS) IMPLANT
CHLORAPREP W/TINT 26 (MISCELLANEOUS) ×2 IMPLANT
CLAMP UMBILICAL CORD (MISCELLANEOUS) ×1 IMPLANT
CLOTH BEACON ORANGE TIMEOUT ST (SAFETY) ×1 IMPLANT
DRSG OPSITE POSTOP 4X10 (GAUZE/BANDAGES/DRESSINGS) ×1 IMPLANT
ELECT REM PT RETURN 9FT ADLT (ELECTROSURGICAL) ×1
ELECTRODE REM PT RTRN 9FT ADLT (ELECTROSURGICAL) ×1 IMPLANT
EXTRACTOR VACUUM M CUP 4 TUBE (SUCTIONS) IMPLANT
GAUZE SPONGE 4X4 12PLY STRL LF (GAUZE/BANDAGES/DRESSINGS) IMPLANT
GLOVE BIOGEL PI IND STRL 7.0 (GLOVE) ×2 IMPLANT
GLOVE BIOGEL PI IND STRL 7.5 (GLOVE) ×1 IMPLANT
GLOVE ECLIPSE 7.0 STRL STRAW (GLOVE) ×1 IMPLANT
GOWN STRL REUS W/TWL LRG LVL3 (GOWN DISPOSABLE) ×2 IMPLANT
KIT ABG SYR 3ML LUER SLIP (SYRINGE) IMPLANT
NDL HYPO 25X5/8 SAFETYGLIDE (NEEDLE) IMPLANT
NEEDLE HYPO 25X5/8 SAFETYGLIDE (NEEDLE) IMPLANT
NS IRRIG 1000ML POUR BTL (IV SOLUTION) ×1 IMPLANT
PACK C SECTION WH (CUSTOM PROCEDURE TRAY) ×1 IMPLANT
PAD ABD DERMACEA PRESS 5X9 (GAUZE/BANDAGES/DRESSINGS) IMPLANT
PAD OB MATERNITY 4.3X12.25 (PERSONAL CARE ITEMS) ×1 IMPLANT
STRIP CLOSURE SKIN 1/2X4 (GAUZE/BANDAGES/DRESSINGS) IMPLANT
SUT PDS AB 0 CTX 60 (SUTURE) ×2 IMPLANT
SUT PLAIN 0 NONE (SUTURE) IMPLANT
SUT PLAIN 2 0 XLH (SUTURE) IMPLANT
SUT VIC AB 0 CT1 36 (SUTURE) IMPLANT
SUT VIC AB 0 CTX 36 (SUTURE) ×3
SUT VIC AB 0 CTX36XBRD ANBCTRL (SUTURE) ×3 IMPLANT
SUT VIC AB 4-0 KS 27 (SUTURE) ×1 IMPLANT
TOWEL OR 17X24 6PK STRL BLUE (TOWEL DISPOSABLE) ×1 IMPLANT
TRAY FOLEY W/BAG SLVR 14FR LF (SET/KITS/TRAYS/PACK) ×1 IMPLANT
WATER STERILE IRR 1000ML POUR (IV SOLUTION) ×1 IMPLANT

## 2022-04-21 NOTE — Transfer of Care (Signed)
Immediate Anesthesia Transfer of Care Note  Patient: Olivia Dean  Procedure(s) Performed: REPEAT CESAREAN SECTION EDC: 04-21-22  ALLERG: NICKEL, PENICILLINS, WELLBUTRIN  PREVIOUS X 1  Patient Location: PACU  Anesthesia Type:Spinal  Level of Consciousness: awake, alert , and oriented  Airway & Oxygen Therapy: Patient Spontanous Breathing  Post-op Assessment: Report given to RN and Post -op Vital signs reviewed and stable  Post vital signs: Reviewed and stable  Last Vitals:  Vitals Value Taken Time  BP 100/62 04/21/22 1130  Temp    Pulse 92 04/21/22 1131  Resp 21 04/21/22 1131  SpO2 98 % 04/21/22 1131  Vitals shown include unvalidated device data.  Last Pain:  Vitals:   04/21/22 0838  TempSrc: Oral         Complications: No notable events documented.

## 2022-04-21 NOTE — H&P (Signed)
OB History and Physical   Olivia Dean is a 32 y.o. female G2P1001 presenting for repeat C section at [redacted]w[redacted]d  Pregnancy has been uncomplicated.  Rh positive, GBS negative 03/29/2022, panorama declined.    OB History     Gravida  2   Para  1   Term  1   Preterm      AB      Living  1      SAB      IAB      Ectopic      Multiple  0   Live Births  1          Past Medical History:  Diagnosis Date   Anxiety    meds 2 years ago   Asthma    exercise induced   Depression    Tachycardia    Past Surgical History:  Procedure Laterality Date   CESAREAN SECTION N/A 08/06/2014   Procedure: CESAREAN SECTION;  Surgeon: Richarda Overlie, MD;  Location: [facility];  Service: Obstetrics;  Laterality: N/A;   MYRINGOTOMY     WISDOM TOOTH EXTRACTION     Family History: family history includes Cancer in her paternal grandmother; Diabetes in her paternal grandmother; Heart attack in her mother; Hypertension in her maternal grandmother, mother, and paternal grandmother; Migraines in her maternal grandfather and maternal uncle; Thyroid disease in her paternal grandmother. Social History:  reports that she has never smoked. She has never used smokeless tobacco. She reports current alcohol use. She reports that she does not use drugs.     Maternal Diabetes: No Genetic Screening: Declined Maternal Ultrasounds/Referrals: Normal Fetal Ultrasounds or other Referrals:  None Maternal Substance Abuse:  No Significant Maternal Medications:  None Significant Maternal Lab Results:  Group B Strep negative Other Comments:  None  Review of Systems - Patient denies fever, chills, SOB, CP, N/V/D.  History   Blood pressure 112/85, pulse 92, temperature 98 F (36.7 C), temperature source Oral, resp. rate 18, height 5\' 1"  (1.549 m), weight 84.8 kg, SpO2 99 %, unknown if currently breastfeeding. Exam Physical Exam   Gen: alert, well appearing, no distress Chest: nonlabored breathing CV: no  peripheral edema Abdomen: soft, gravid  Ext: no evidence of DVT  Prenatal labs: ABO, Rh: --/--/O POS (04/03 8341) Antibody: NEG (04/03 9622) Rubella: Immune (10/04 0000) RPR: NON REACTIVE (04/03 0946)  HBsAg: Negative (10/04 0000)  HIV: Non-reactive (10/04 0000)  GBS: Negative/-- (03/13 0000)   Assessment/Plan: Admit to Labor and Delivery Patient was counseled on the risks of Cesarean delivery, which include but are not limited to bleeding, infection, damage to nearby organs including bowel/bladder/ureter, need for additional procedure or blood transfusion, and implications for future pregnancy.  Patient agreeable to procedure, all questions answered.     Lyn Henri 04/21/2022, 9:18 AM

## 2022-04-21 NOTE — Anesthesia Postprocedure Evaluation (Signed)
Anesthesia Post Note  Patient: Olivia Dean  Procedure(s) Performed: REPEAT CESAREAN SECTION EDC: 04-21-22  ALLERG: NICKEL, PENICILLINS, WELLBUTRIN  PREVIOUS X 1     Patient location during evaluation: PACU Anesthesia Type: Spinal Level of consciousness: awake and alert Pain management: pain level controlled Vital Signs Assessment: post-procedure vital signs reviewed and stable Respiratory status: spontaneous breathing and respiratory function stable Cardiovascular status: blood pressure returned to baseline and stable Postop Assessment: spinal receding Anesthetic complications: no  No notable events documented.  Last Vitals:  Vitals:   04/21/22 1225 04/21/22 1232  BP: (!) 106/59 96/89  Pulse:  87  Resp:    Temp:    SpO2:  98%    Last Pain:  Vitals:   04/21/22 1200  TempSrc: Oral  PainSc: 0-No pain   Pain Goal:                   Kennieth Rad

## 2022-04-21 NOTE — Anesthesia Preprocedure Evaluation (Signed)
Anesthesia Evaluation  Patient identified by MRN, date of birth, ID band Patient awake    Reviewed: Allergy & Precautions, NPO status , Patient's Chart, lab work & pertinent test results  Airway Mallampati: II  TM Distance: >3 FB     Dental   Pulmonary asthma    breath sounds clear to auscultation       Cardiovascular negative cardio ROS  Rhythm:Regular Rate:Normal     Neuro/Psych negative neurological ROS     GI/Hepatic Neg liver ROS,GERD  ,,  Endo/Other  negative endocrine ROS    Renal/GU negative Renal ROS     Musculoskeletal   Abdominal   Peds  Hematology  (+) Blood dyscrasia, anemia   Anesthesia Other Findings   Reproductive/Obstetrics (+) Pregnancy                             Anesthesia Physical Anesthesia Plan  ASA: 2  Anesthesia Plan: Spinal   Post-op Pain Management:    Induction:   PONV Risk Score and Plan: 2 and Treatment may vary due to age or medical condition, Dexamethasone and Ondansetron  Airway Management Planned: Natural Airway  Additional Equipment:   Intra-op Plan:   Post-operative Plan:   Informed Consent: I have reviewed the patients History and Physical, chart, labs and discussed the procedure including the risks, benefits and alternatives for the proposed anesthesia with the patient or authorized representative who has indicated his/her understanding and acceptance.       Plan Discussed with:   Anesthesia Plan Comments:        Anesthesia Quick Evaluation

## 2022-04-21 NOTE — Lactation Note (Signed)
This note was copied from a baby's chart. Lactation Consultation Note  Patient Name: Olivia Dean [id number] Date: 04/21/2022 Age:31 hours Reason for consult: Initial assessment;Term (C/S delivery and infant LGA greater than 9 lbs at birth, see Birth Parent- MR)  P2, term female infant. Per Birth Parent infant is breastfeeding well no concerns, Birth Parent like using the cradle hold, infant was BF when LC entered the room, with audible swallows with stimulation, infant BF for 16 minutes. Afterwards LC reviewed hand expression with breast model and Birth Parent self expressed and infant was given 3 mls of colostrum by spoon and appeared content after the feeding. Birth Parent will continue to BF infant by cues on demand, 8 to 12+ times within 24 hours, skin to skin. LC discussed infant's input and output, Per Support Person, infant had 3 voids and 6 stools since birth. Birth Parent knows to call RN/LC for further latch assistance if needed. Birth Parent was  made aware of O/P services, breastfeeding support groups, community resources, and our phone # for post-discharge questions.    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  Feeding Mother's Current Feeding Choice: Breast Milk  LATCH Score Latch: Grasps breast easily, tongue down, lips flanged, rhythmical sucking.  Audible Swallowing: A few with stimulation  Type of Nipple: Everted at rest and after stimulation  Comfort (Breast/Nipple): Soft / non-tender  Hold (Positioning): No assistance needed to correctly position infant at breast.  LATCH Score: 9   Lactation Tools Discussed/Used  Per Birth Parent, she has Spectra 2 DEBP, Hands free DEBP and hand  pump at home.   Interventions Interventions: Breast feeding basics reviewed;Skin to skin;Hand express;Breast compression;LC Services brochure;Education  Discharge    Consult Status Consult Status: Follow-up Date: 04/22/22 Follow-up type: In-patient    Frederico Hamman 04/21/2022, 8:17 PM

## 2022-04-21 NOTE — Op Note (Signed)
Operative Note  PROCEDURE DATE: 04/21/22   PREOPERATIVE DIAGNOSIS: History of prior C section   POSTOPERATIVE DIAGNOSIS: The same   PROCEDURE:    Repeat Low Transverse Cesarean Section   SURGEON:  Dr. Nilda SimmerAustin Samra Pesch ASSISTANT: Dr. Salvadore DomAutry-Lott  An experienced assistant was required given the standard of surgical care given the complexity of the case.  This assistant was needed for exposure, dissection, suctioning, retraction, instrument exchange, assisting with delivery with administration of fundal pressure, and for overall help during the procedure.    INDICATIONS: This is a 32 y.o. yo G2P1001 at 6340w0d requiring cesarean section secondary to history of prior C section.   Decision made to proceed with LTCS. The risks of cesarean section discussed with the patient included but were not limited to: bleeding which may require transfusion or reoperation; infection which may require antibiotics; injury to bowel, bladder, ureters or other surrounding organs; injury to the fetus; need for additional procedures including hysterectomy in the event of a life-threatening hemorrhage; placental abnormalities wth subsequent pregnancies, incisional problems, thromboembolic phenomenon and other postoperative/anesthesia complications. The patient agreed with the proposed plan, giving informed consent for the procedure.     FINDINGS:  Viable female infant in vertex presentation, APGARs 9, 9 ,  Weight pending, Amniotic fluid clear,  Intact placenta, three vessel cord.  Grossly normal uterus  .   ANESTHESIA:    Epidural ESTIMATED BLOOD LOSS: 502 cc SPECIMENS: Placenta for labor and delivery COMPLICATIONS: None immediate    PROCEDURE IN DETAIL:  The patient received intravenous antibiotics (Gent/clinda) and had sequential compression devices applied to her lower extremities while in the preoperative area.  She was then taken to the operating room where epidural anesthesia was dosed up to surgical level and was  found to be adequate. She was then placed in a dorsal supine position with a leftward tilt, and prepped and draped in a sterile manner.  A foley catheter was placed into her bladder and attached to constant gravity.  After an adequate timeout was performed, a Pfannenstiel skin incision was made with scalpel and carried through to the underlying layer of fascia. Significant fascial adhesions were noted. The fascia was incised in the midline and this incision was extended bilaterally using the Mayo scissors. Kocher clamps were applied to the superior aspect of the fascial incision and the underlying rectus muscles were dissected off bluntly. A similar process was carried out on the inferior aspect of the facial incision. The rectus muscles were separated in the midline bluntly and the peritoneum was entered bluntly.  A bladder flap was created sharply and developed bluntly. A transverse hysterotomy was made with a scalpel and extended bilaterally bluntly. The bladder blade was then removed. The infant was successfully delivered, and cord was clamped and cut and infant was handed over to awaiting neonatology team. Uterine massage was then administered and the placenta delivered intact with three-vessel cord. The uterus was cleared of clot and debris.  The hysterotomy was closed with 0 vicryl.  A second imbricating suture of 0-vicryl was used to reinforce the incision and aid in hemostasis.The fascia was closed with 0-Vicryl in a running fashion with good restoration of anatomy.  The subcutaneus tissue was irrigated and was reapproximated using running plain gut.  The skin was closed with 4-0 Vicryl in a subcuticular fashion.  All surgical site and was hemostatic at end of procedure without any further bleeding on exam.    Pt tolerated the procedure well. All sponge/lap/needle counts were correct  X 2. Pt taken to recovery room in stable condition.     Nilda Simmer MD

## 2022-04-21 NOTE — Anesthesia Procedure Notes (Signed)
Spinal  Patient location during procedure: OR Start time: 04/21/2022 10:00 AM End time: 04/21/2022 10:05 AM Reason for block: surgical anesthesia Staffing Performed: anesthesiologist  Anesthesiologist: Marcene Duos, MD Performed by: Marcene Duos, MD Authorized by: Marcene Duos, MD   Preanesthetic Checklist Completed: patient identified, IV checked, site marked, risks and benefits discussed, surgical consent, monitors and equipment checked, pre-op evaluation and timeout performed Spinal Block Patient position: sitting Prep: DuraPrep Patient monitoring: heart rate, cardiac monitor, continuous pulse ox and blood pressure Approach: midline Location: L3-4 Injection technique: single-shot Needle Needle type: Sprotte  Needle gauge: 24 G Needle length: 9 cm Assessment Sensory level: T4 Events: CSF return

## 2022-04-22 LAB — CBC
HCT: 27.6 % — ABNORMAL LOW (ref 36.0–46.0)
Hemoglobin: 9 g/dL — ABNORMAL LOW (ref 12.0–15.0)
MCH: 27.4 pg (ref 26.0–34.0)
MCHC: 32.6 g/dL (ref 30.0–36.0)
MCV: 83.9 fL (ref 80.0–100.0)
Platelets: 170 10*3/uL (ref 150–400)
RBC: 3.29 MIL/uL — ABNORMAL LOW (ref 3.87–5.11)
RDW: 13.5 % (ref 11.5–15.5)
WBC: 17.7 10*3/uL — ABNORMAL HIGH (ref 4.0–10.5)
nRBC: 0 % (ref 0.0–0.2)

## 2022-04-22 LAB — BIRTH TISSUE RECOVERY COLLECTION (PLACENTA DONATION)

## 2022-04-22 MED ORDER — FERROUS SULFATE 325 (65 FE) MG PO TABS
325.0000 mg | ORAL_TABLET | ORAL | Status: DC
  Administered 2022-04-22: 325 mg via ORAL
  Filled 2022-04-22: qty 1

## 2022-04-22 NOTE — Progress Notes (Signed)
Postpartum Progress Note  Postpartum Day 1 s/p repeat Cesarean section.  Subjective:  Patient reports no overnight events.  She reports well controlled pain, ambulating without difficulty, voiding spontaneously, tolerating PO.  Vaginal bleeding is appropriate. She denies lightheadedness, dizziness.  Objective: Blood pressure (!) 103/56, pulse 68, temperature 98 F (36.7 C), temperature source Oral, resp. rate 16, height 5\' 1"  (1.549 m), weight 84.8 kg, SpO2 98 %, unknown if currently breastfeeding.  Physical Exam:  General: alert and no distress Lochia: appropriate Abdomen: soft, ATTP Uterine Fundus: firm Incision: dressing in place DVT Evaluation: No evidence of DVT seen on physical exam.  Recent Labs    04/19/22 0946 04/22/22 0526  HGB 11.2* 9.0*  HCT 35.8* 27.6*    Assessment/Plan: Postpartum Day 1, s/p C-section Baby boy - desires circ. Will perform if cleared by nursery.  Acute blood loss anemia - Fe/Colace. No signs or symptoms of anemia.  Lactation following Doing well, continue routine postpartum care. Anticipate discharge PPD#2 or 3   LOS: 1 day   Lyn Henri 04/22/2022, 7:28 AM

## 2022-04-22 NOTE — Progress Notes (Signed)
CSW received consult for hx of Anxiety and Depression and Edinburgh Postnatal Depression scale score of 10. CSW met with MOB to offer support and complete assessment. When CSW entered room, MOB was laying in hospital bed holding and bonding with infant. FOB was present. MOB provided verbal consent to speak in front of FOB about anything. CSW introduced self and reason for consult. MOB presented as calm and remained engaged throughout consult.  CSW inquired how MOB is feeling emotionally since infant's delivery. MOB shares that she has felt "okay", sharing that infant was awake throughout most of the night and she is tired. MOB notes she has experienced some feelings of anxiety about infant since his arrival, noting that she felt concerned about the possibility of infant's blanket covering his face. CSW inquired how MOB felt emotionally during pregnancy. 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  CSW inquired about MOB's mental health history. MOB confirms that she was diagnosed with depression and anxiety in 2018, adding that she has experienced "situational" depression in the past. MOB states that she feels over the past year and a half, she has been able to cope with symptoms of anxiety and depression with more ease. MOB states that she has been seeing a therapist for the past year through [facility] which she has found beneficial. MOB states she plans to call to follow up with her therapist once she has adjusted to infant's arrival. MOB shares that over the past year and a half, she has been able to identify her emotions and talk about them with FOB with increased ease. CSW inquired about a history of postpartum depression after the birth of her son who was born in 2016. MOB  denies a history of postpartum depression but shares that she did endorse some symptoms of anxiety after the birth of her first son. MOB states she was prescribed a psychotropic medication after her older son was born (MOB was unable to recall the name of the medication) but shared that it caused side effects so she discontinued the medication. MOB recalls experiencing fluid retention. MOB shares that her symptoms of anxiety improved after she made lifestyle changes, marked by taking vitamins and supplements and exercise. MOB does not take psychotropic medication at this time. MOB identified FOB, her parents, and her best friend as supports. MOB denied current SI/HI.   CSW provided education regarding the baby blues period vs. perinatal mood disorders, discussed treatment and gave resources for mental health follow up if concerns arise.  CSW recommends self-evaluation during the postpartum time period using the New Mom Checklist from Postpartum Progress and encouraged MOB to contact a medical professional if symptoms are noted at any time.    CSW provided review of Sudden Infant Death Syndrome (SIDS) precautions.    MOB reports she has all needed items for infant, including a car seat and bassinet. MOB has chosen [facility] for infant's follow up care.  CSW identifies no further need for intervention and no barriers to discharge at this time.  Signed,  Claudeen Leason K. Randal Yepiz, MSW, LCSWA, LCASA 04/22/2022 11:29 AM 

## 2022-04-23 MED ORDER — ACETAMINOPHEN 325 MG PO TABS: 650.0000 mg | ORAL_TABLET | ORAL | 0 refills | Status: AC | PRN

## 2022-04-23 MED ORDER — IBUPROFEN 600 MG PO TABS: 600.0000 mg | ORAL_TABLET | Freq: Four times a day (QID) | ORAL | 0 refills | Status: DC

## 2022-04-23 MED ORDER — FERROUS SULFATE 325 (65 FE) MG PO TABS: 325.0000 mg | ORAL_TABLET | ORAL | 1 refills | Status: AC

## 2022-04-23 MED ORDER — DOCUSATE SODIUM 100 MG PO CAPS: 100.0000 mg | ORAL_CAPSULE | Freq: Every day | ORAL | 0 refills | Status: AC

## 2022-04-23 MED ORDER — OXYCODONE HCL 5 MG PO TABS: 5.0000 mg | ORAL_TABLET | ORAL | 0 refills | Status: AC | PRN

## 2022-04-23 NOTE — Discharge Summary (Signed)
Obstetric Discharge Summary  Olivia Dean is a 32 y.o. female that presented on 04/21/2022 for scheduled repeat C section.  She delivered a viable female infant on 04/21/2022.  Her postpartum course was uncomplicated and on PPD#2, she reported well controlled pain, spontaneous voiding, ambulating without difficulty, and tolerating PO.  She was stable for discharge home on 04/23/2022 with plans for in-office follow up.  Hemoglobin  Date Value Ref Range Status  04/22/2022 9.0 (L) 12.0 - 15.0 g/dL Final   HCT  Date Value Ref Range Status  04/22/2022 27.6 (L) 36.0 - 46.0 % Final    Physical Exam:  General: alert and no distress Lochia: appropriate Uterine Fundus: firm Incision: healing well DVT Evaluation: No evidence of DVT seen on physical exam.  Discharge Diagnoses: Term Pregnancy-delivered  Discharge Information: Date: 04/23/2022 Activity: Pelvic rest, as tolerated Diet: routine Medications: Tylenol, motrin, oxycodone Condition: stable Instructions: Refer to practice specific booklet.  Discussed prior to discharge.  Discharge to: Home   Newborn Data: Live born female  Birth Weight: 10 lb 9.3 oz (4800 g) APGAR: 9, 9  Newborn Delivery   Birth date/time: 04/21/2022 10:34:00 Delivery type: C-Section, Low Transverse Trial of labor: No C-section categorization: Repeat      Home with mother.  Lyn Henri 04/23/2022, 8:13 PM

## 2022-04-23 NOTE — Lactation Note (Signed)
This note was copied from a baby's chart. Lactation Consultation Note  Patient Name: Olivia Dean [id number] Date: 04/23/2022 Age:31 hours Reason for consult: Follow-up assessment;Term;Infant weight loss  LC into room for follow up. Parents are expecting discharge. Infant is still at breast but almost sleep. Parent reports good feedings and plentiful output. Talked about weight loss (~11% @48  HOL). Encouraged supplementation with expressed breast milk. Parent prefers using a spoon, LC provided a feeding cup, too. Parents have bottles at home and an electric breast pump. Discussed normal newborn behavior and patterns in addition to clusterfeeding. Talked about milk coming into volume. Reviewed local resources for support after discharge.   Plan: 1-Breastfeeding on demand or 8-12 times in 24h. 2-Supplement as needed with expressed breast milk 3-Encouraged parental rest, hydration and food intake.   Contact LC as needed for feeds/support/concerns/questions. All questions answered at this time.   Maternal Data Has patient been taught Hand Expression?: Yes Does the patient have breastfeeding experience prior to this delivery?: Yes How long did the patient breastfeed?: 14 weeks  Feeding Mother's Current Feeding Choice: Breast Milk  Interventions Interventions: Skin to skin;Education;Expressed milk;Coconut oil  Discharge Discharge Education: Engorgement and breast care;Warning signs for feeding baby Pump: Personal WIC Program: No  Consult Status Consult Status: Complete Date: 04/23/22 Follow-up type: Call as needed    Charniece Venturino A Higuera Ancidey 04/23/2022, 10:37 AM

## 2022-04-23 NOTE — Progress Notes (Signed)
Postpartum Progress Note  Postpartum Day 2 s/p repeat Cesarean section.  Subjective:  Patient reports no overnight events.  She reports well controlled pain, ambulating without difficulty, voiding spontaneously, tolerating PO.  Vaginal bleeding is appropriate. She denies lightheadedness, dizziness.  Objective: Blood pressure 102/61, pulse 63, temperature (!) 97.3 F (36.3 C), temperature source Oral, resp. rate 18, height 5\' 1"  (1.549 m), weight 84.8 kg, SpO2 100 %, unknown if currently breastfeeding.  Physical Exam:  General: alert and no distress Lochia: appropriate Abdomen: soft, ATTP Uterine Fundus: firm Incision: dressing in place DVT Evaluation: No evidence of DVT seen on physical exam.  Recent Labs    04/22/22 0526  HGB 9.0*  HCT 27.6*     Assessment/Plan: Postpartum Day 2, s/p C-section Baby boy - s/p circ  Acute blood loss anemia - Fe/Colace. No signs or symptoms of anemia.  Lactation following Doing well, continue routine postpartum care. Anticipate discharge home today   LOS: 2 days   Lyn Henri 04/23/2022, 8:08 AM

## 2022-04-24 ENCOUNTER — Telehealth (HOSPITAL_COMMUNITY): Payer: Self-pay | Admitting: *Deleted

## 2022-04-24 ENCOUNTER — Encounter (HOSPITAL_COMMUNITY): Payer: Self-pay | Admitting: Obstetrics and Gynecology

## 2022-04-24 NOTE — Telephone Encounter (Signed)
Patient had EPDS of 10 while in hospital. CSW consult completed during that time. Faxed Dr. Renaldo Fiddler today to make her aware of score.  Duffy Rhody, RN 04-24-2022 at 1:58pm

## 2022-05-01 ENCOUNTER — Telehealth (HOSPITAL_COMMUNITY): Payer: Self-pay | Admitting: *Deleted

## 2022-05-01 NOTE — Telephone Encounter (Signed)
Patient voiced no questions or concerns regarding her health at this time. EPDS not done. Patient scored a 10 on 04/21/22 while in the hospital and provider was notified via fax on 04/24/22 by Duffy Rhody, RN. Patient voiced no questions or concerns regarding infant at this time. RN reviewed ABCs of safe sleep. Patient verbalized understanding. Patient requested RN email information on hospital's virtual postpartum classes and support groups. Email sent. Deforest Hoyles, RN, 05/01/22, [phone]

## 2022-07-14 ENCOUNTER — Inpatient Hospital Stay (HOSPITAL_COMMUNITY): Payer: BC Managed Care – PPO

## 2022-07-14 ENCOUNTER — Inpatient Hospital Stay (HOSPITAL_COMMUNITY)
Admission: AD | Admit: 2022-07-14 | Discharge: 2022-07-15 | Disposition: A | Discharge status: Home / Self Care | Payer: BC Managed Care – PPO | Attending: Obstetrics and Gynecology | Admitting: Obstetrics and Gynecology

## 2022-07-14 DIAGNOSIS — O26891 Other specified pregnancy related conditions, first trimester: Secondary | ICD-10-CM | POA: Insufficient documentation

## 2022-07-14 DIAGNOSIS — Z3201 Encounter for pregnancy test, result positive: Secondary | ICD-10-CM | POA: Insufficient documentation

## 2022-07-14 DIAGNOSIS — Z30432 Encounter for removal of intrauterine contraceptive device: Secondary | ICD-10-CM

## 2022-07-14 DIAGNOSIS — Z3490 Encounter for supervision of normal pregnancy, unspecified, unspecified trimester: Secondary | ICD-10-CM | POA: Diagnosis not present

## 2022-07-14 DIAGNOSIS — Z3A01 Less than 8 weeks gestation of pregnancy: Secondary | ICD-10-CM | POA: Diagnosis not present

## 2022-07-14 LAB — CBC
HCT: 37.3 % (ref 36.0–46.0)
Hemoglobin: 11.8 g/dL — ABNORMAL LOW (ref 12.0–15.0)
MCH: 26.5 pg (ref 26.0–34.0)
MCHC: 31.6 g/dL (ref 30.0–36.0)
MCV: 83.8 fL (ref 80.0–100.0)
Platelets: 295 10*3/uL (ref 150–400)
RBC: 4.45 MIL/uL (ref 3.87–5.11)
RDW: 14.7 % (ref 11.5–15.5)
WBC: 12.1 10*3/uL — ABNORMAL HIGH (ref 4.0–10.5)
nRBC: 0 % (ref 0.0–0.2)

## 2022-07-14 LAB — COMPREHENSIVE METABOLIC PANEL
ALT: 27 U/L (ref 0–44)
AST: 22 U/L (ref 15–41)
Albumin: 4 g/dL (ref 3.5–5.0)
Alkaline Phosphatase: 77 U/L (ref 38–126)
Anion gap: 12 (ref 5–15)
BUN: 8 mg/dL (ref 6–20)
CO2: 22 mmol/L (ref 22–32)
Calcium: 9.4 mg/dL (ref 8.9–10.3)
Chloride: 103 mmol/L (ref 98–111)
Creatinine, Ser: 0.76 mg/dL (ref 0.44–1.00)
GFR, Estimated: >60 mL/min (ref >60)
Glucose, Bld: 111 mg/dL — ABNORMAL HIGH (ref 70–99)
Potassium: 4.3 mmol/L (ref 3.5–5.1)
Sodium: 137 mmol/L (ref 135–145)
Total Bilirubin: 0.2 mg/dL — ABNORMAL LOW (ref 0.3–1.2)
Total Protein: 7.1 g/dL (ref 6.5–8.1)

## 2022-07-14 LAB — POCT PREGNANCY, URINE: Preg Test, Ur: POSITIVE — AB

## 2022-07-14 LAB — HCG, QUANTITATIVE, PREGNANCY: hCG, Beta Chain, Quant, S: 5133 m[IU]/mL — ABNORMAL HIGH (ref <5)

## 2022-07-14 MED ORDER — ACETAMINOPHEN 500 MG PO TABS
1000.0000 mg | ORAL_TABLET | Freq: Once | ORAL | Status: AC
  Administered 2022-07-14: 1000 mg via ORAL
  Filled 2022-07-14: qty 2

## 2022-07-14 NOTE — MAU Note (Signed)
Pt says she del 04-21-2022 by C/S  by Dr Lorane Gell  All ok- On  6-8 weeks check-up-06-08-2022- got copper IUD. First sex- was 48 hrs after IUD placement  No LMP-  bottle feeding- was worried. Last sex- last night  Took HPT - today- positive  No VB Feels lower abd cramps and lower back and right side

## 2022-07-15 ENCOUNTER — Encounter (HOSPITAL_COMMUNITY): Payer: Self-pay | Admitting: *Deleted

## 2022-07-15 DIAGNOSIS — Z30432 Encounter for removal of intrauterine contraceptive device: Secondary | ICD-10-CM

## 2022-07-15 DIAGNOSIS — Z3490 Encounter for supervision of normal pregnancy, unspecified, unspecified trimester: Secondary | ICD-10-CM

## 2022-07-15 LAB — WET PREP, GENITAL
Sperm: NONE SEEN
Trich, Wet Prep: NONE SEEN
WBC, Wet Prep HPF POC: <10 (ref <10)
Yeast Wet Prep HPF POC: NONE SEEN

## 2022-07-15 NOTE — MAU Provider Note (Signed)
History     CSN: [id number]  Arrival date and time: 07/14/22 2037     Chief Complaint  Patient presents with   positive HPT    Olivia Dean is a 32 y.o. [id number] at Unknown who receives care at [facility].  She presents today for +UPT in setting of Paragard IUD. Patient states she had IUD placed May 20th and had bleeding from May 17th-27th that was heavy.  She states that the bleeding subsided and she has not had bleeding since.  She reports today she started to have right sided abdominal pain that was sharp and constant.  She reports no relieving or worsening factors. She rates the pain a 6-7/10. She also reports back pain in the form of cramping.  She initially regarded her symptoms to an upset stomach, but noted that she hadn't had a period.  So reports she took a UPT at 1700 that was positive.  She reports she has an appt on July 10th   OB History     Gravida  3   Para  2   Term  2   Preterm      AB      Living  2      SAB      IAB      Ectopic      Multiple  0   Live Births  2           Past Medical History:  Diagnosis Date   Anxiety    meds 2 years ago   Asthma    exercise induced   Depression    Tachycardia     Past Surgical History:  Procedure Laterality Date   CESAREAN SECTION N/A 08/06/2014   Procedure: CESAREAN SECTION;  Surgeon: Richarda Overlie, MD;  Location: [facility];  Service: Obstetrics;  Laterality: N/A;   CESAREAN SECTION N/A 04/21/2022   Procedure: REPEAT CESAREAN SECTION EDC: 04-21-22  ALLERG: NICKEL, PENICILLINS, WELLBUTRIN  PREVIOUS X 1;  Surgeon: Lyn Henri, MD;  Location: [facility];  Service: Obstetrics;  Laterality: N/A;   MYRINGOTOMY     WISDOM TOOTH EXTRACTION      Family History  Problem Relation Age of Onset   Heart attack Mother    Hypertension Mother    Migraines Maternal Uncle    Hypertension Maternal Grandmother    Migraines Maternal Grandfather    Hypertension Paternal Grandmother    Diabetes  Paternal Grandmother    Thyroid disease Paternal Grandmother    Cancer Paternal Grandmother        thyroid    Social History   Tobacco Use   Smoking status: Never   Smokeless tobacco: Never  Substance Use Topics   Alcohol use: Yes    Comment: occ   Drug use: No    Allergies:  Allergies  Allergen Reactions   Penicillins Hives   Wellbutrin [Bupropion] Hives   Nickel Rash    No medications prior to admission.    Review of Systems  Gastrointestinal:  Negative for nausea and vomiting.  Genitourinary:  Negative for difficulty urinating, dysuria, vaginal bleeding and vaginal discharge.   Physical Exam   Blood pressure 130/76, pulse (!) 111, temperature 98.2 F (36.8 C), temperature source Oral, resp. rate 16, height 5\' 1"  (1.549 m), weight 77 kg, unknown if currently breastfeeding.  Physical Exam Vitals reviewed. Exam conducted with a chaperone present.  Constitutional:      General: She is not in acute distress.  Appearance: Normal appearance. She is not toxic-appearing.  HENT:     Head: Normocephalic and atraumatic.  Eyes:     Conjunctiva/sclera: Conjunctivae normal.  Cardiovascular:     Rate and Rhythm: Normal rate.  Pulmonary:     Effort: Pulmonary effort is normal. No respiratory distress.  Abdominal:     Palpations: Abdomen is soft.     Tenderness: There is no abdominal tenderness.  Genitourinary:    Comments:  Speculum Exam: -See IUD removal Note -Bimanual Exam:  Deferred  Musculoskeletal:        General: Normal range of motion.     Cervical back: Normal range of motion.  Skin:    General: Skin is warm and dry.  Neurological:     Mental Status: She is alert and oriented to person, place, and time.  Psychiatric:        Mood and Affect: Mood normal.        Behavior: Behavior normal.     MAU Course  Procedures Results for orders placed or performed during the hospital encounter of 07/14/22 (from the past 24 hour(s))  Pregnancy, urine POC      Status: Abnormal   Collection Time: 07/14/22  9:08 PM  Result Value Ref Range   Preg Test, Ur POSITIVE (A) NEGATIVE  CBC     Status: Abnormal   Collection Time: 07/14/22 10:19 PM  Result Value Ref Range   WBC 12.1 (H) 4.0 - 10.5 K/uL   RBC 4.45 3.87 - 5.11 MIL/uL   Hemoglobin 11.8 (L) 12.0 - 15.0 g/dL   HCT 96.0 45.4 - 09.8 %   MCV 83.8 80.0 - 100.0 fL   MCH 26.5 26.0 - 34.0 pg   MCHC 31.6 30.0 - 36.0 g/dL   RDW 11.9 14.7 - 82.9 %   Platelets 295 150 - 400 K/uL   nRBC 0.0 0.0 - 0.2 %  hCG, quantitative, pregnancy     Status: Abnormal   Collection Time: 07/14/22 10:19 PM  Result Value Ref Range   hCG, Beta Chain, Quant, S 5,133 (H) <5 mIU/mL  Comprehensive metabolic panel     Status: Abnormal   Collection Time: 07/14/22 10:19 PM  Result Value Ref Range   Sodium 137 135 - 145 mmol/L   Potassium 4.3 3.5 - 5.1 mmol/L   Chloride 103 98 - 111 mmol/L   CO2 22 22 - 32 mmol/L   Glucose, Bld 111 (H) 70 - 99 mg/dL   BUN 8 6 - 20 mg/dL   Creatinine, Ser 5.62 0.44 - 1.00 mg/dL   Calcium 9.4 8.9 - 13.0 mg/dL   Total Protein 7.1 6.5 - 8.1 g/dL   Albumin 4.0 3.5 - 5.0 g/dL   AST 22 15 - 41 U/L   ALT 27 0 - 44 U/L   Alkaline Phosphatase 77 38 - 126 U/L   Total Bilirubin 0.2 (L) 0.3 - 1.2 mg/dL   GFR, Estimated >86 >57 mL/min   Anion gap 12 5 - 15  Wet prep, genital     Status: Abnormal   Collection Time: 07/14/22 11:40 PM  Result Value Ref Range   Yeast Wet Prep HPF POC NONE SEEN NONE SEEN   Trich, Wet Prep NONE SEEN NONE SEEN   Clue Cells Wet Prep HPF POC PRESENT (A) NONE SEEN   WBC, Wet Prep HPF POC <10 <10   Sperm NONE SEEN    US OB LESS THAN 14 WEEKS WITH OB TRANSVAGINAL  Result Date: 07/14/2022 CLINICAL DATA:  Abdominal pain during pregnancy, positive qualitative urine pregnancy test with IUD in place EXAM: OBSTETRIC <14 WK [place] AND TRANSVAGINAL OB US TECHNIQUE: Both transabdominal and transvaginal ultrasound examinations were performed for complete evaluation of the gestation as  well as the maternal uterus, adnexal regions, and pelvic cul-de-sac. Transvaginal technique was performed to assess early pregnancy. COMPARISON:  None Available. FINDINGS: Intrauterine gestational sac: Single Yolk sac:  Not Visualized. Embryo:  Not Visualized. MSD: 5.2 mm   5 w   2 d Subchorionic hemorrhage:  None visualized. Maternal uterus/adnexae: An IUD is identified within the cervical canal. The gestational sac above is located within the fundal region of the uterus. No adnexal masses. Trace pelvic free fluid. IMPRESSION: 1. Probable early intrauterine gestational sac, but no yolk sac, fetal pole, or cardiac activity yet visualized. Recommend follow-up quantitative B-HCG levels and follow-up US in 14 days to assess viability. This recommendation follows SRU consensus guidelines: Diagnostic Criteria for Nonviable Pregnancy Early in the First Trimester. Malva Limes Med 2013; 161:0960-45. 2. IUD identified within the cervical canal. 3. Trace pelvic free fluid. Electronically Signed   By: Sharlet Salina M.D.   On: 07/14/2022 23:22    IUD Removal Procedure Patient identified, informed consent performed, consent signed.  Patient was in the dorsal lithotomy position, normal external genitalia was noted.  A speculum was placed in the patient's vagina, normal discharge was noted, no lesions. The cervix was visualized, no lesions, no abnormal discharge.  The posterior aspect of IUD noted at external os. The posterior aspect of the IUD were grasped and pulled using ring forceps. The IUD was removed in its entirety.  Patient tolerated the procedure well.    MDM Pelvic Exam; Wet Prep and GC/CT Labs: UA, UPT, CBC, hCG, CMP Ultrasound IUD Removal Assessment and Plan  32 year old [id number] at Unknown GA +UPT  -POC Reviewed. -Exam performed.  -Labs ordered. -Patient offered and accepts pain medication. -Tylenol ordered. -Send for [place] and await results.   Cherre Robins 07/15/2022  Reassessment (23:52 AM) -[place]  results reveal IUGS with IUD in cervical canal. -Dr. Marquis Lunch. Anyanwu consulted and informed of patient status, evaluation, interventions, and results. Advised: *Remove IUD -Provider to bedside to discuss. -Informed of findings and recommendation for removal.  -Patient agreeable. Reviewed risks and benefits. Consents signed. -IUD removed as above. -Patient instructed to contact primary office and notify of pregnancy and need to modify upcoming appt for repeat US. -Precautions reviewed. -Encouraged to call primary office or return to MAU if symptoms worsen or with the onset of new symptoms. -Discharged to home in stable condition.  Cherre Robins MSN, CNM Advanced Practice Provider, [facility]

## 2022-07-17 LAB — GC/CHLAMYDIA PROBE AMP (~~LOC~~) NOT AT ARMC
Chlamydia: NEGATIVE
Comment: NEGATIVE
Comment: NORMAL
Neisseria Gonorrhea: NEGATIVE
